# Patient Record
Sex: Female | Born: 1955 | Race: White | Hispanic: No | Marital: Married | Smoking: Never smoker
Health system: Southern US, Community
[De-identification: ages and names within clinical notes are randomized; demographics above are authoritative.]

## PROBLEM LIST (undated history)

## (undated) DIAGNOSIS — K449 Diaphragmatic hernia without obstruction or gangrene: Secondary | ICD-10-CM

## (undated) DIAGNOSIS — D259 Leiomyoma of uterus, unspecified: Secondary | ICD-10-CM

## (undated) DIAGNOSIS — F329 Major depressive disorder, single episode, unspecified: Secondary | ICD-10-CM

## (undated) DIAGNOSIS — M797 Fibromyalgia: Secondary | ICD-10-CM

## (undated) DIAGNOSIS — G4700 Insomnia, unspecified: Secondary | ICD-10-CM

## (undated) DIAGNOSIS — R5382 Chronic fatigue, unspecified: Secondary | ICD-10-CM

## (undated) DIAGNOSIS — M1990 Unspecified osteoarthritis, unspecified site: Secondary | ICD-10-CM

## (undated) DIAGNOSIS — J45909 Unspecified asthma, uncomplicated: Secondary | ICD-10-CM

## (undated) DIAGNOSIS — E079 Disorder of thyroid, unspecified: Secondary | ICD-10-CM

## (undated) DIAGNOSIS — F32A Depression, unspecified: Secondary | ICD-10-CM

## (undated) DIAGNOSIS — K219 Gastro-esophageal reflux disease without esophagitis: Secondary | ICD-10-CM

## (undated) DIAGNOSIS — E785 Hyperlipidemia, unspecified: Secondary | ICD-10-CM

## (undated) HISTORY — PX: APPENDECTOMY: SHX54

## (undated) HISTORY — DX: Major depressive disorder, single episode, unspecified: F32.9

## (undated) HISTORY — DX: Disorder of thyroid, unspecified: E07.9

## (undated) HISTORY — DX: Hyperlipidemia, unspecified: E78.5

## (undated) HISTORY — PX: TONSILECTOMY, ADENOIDECTOMY, BILATERAL MYRINGOTOMY AND TUBES: SHX2538

## (undated) HISTORY — DX: Leiomyoma of uterus, unspecified: D25.9

## (undated) HISTORY — DX: Unspecified asthma, uncomplicated: J45.909

## (undated) HISTORY — PX: OVARIAN CYST REMOVAL: SHX89

## (undated) HISTORY — DX: Insomnia, unspecified: G47.00

## (undated) HISTORY — DX: Chronic fatigue, unspecified: R53.82

## (undated) HISTORY — PX: TMJ ARTHROSCOPY: SHX1067

## (undated) HISTORY — DX: Depression, unspecified: F32.A

## (undated) HISTORY — DX: Fibromyalgia: M79.7

## (undated) HISTORY — DX: Diaphragmatic hernia without obstruction or gangrene: K44.9

## (undated) HISTORY — DX: Unspecified osteoarthritis, unspecified site: M19.90

## (undated) HISTORY — DX: Gastro-esophageal reflux disease without esophagitis: K21.9

---

## 1981-12-18 HISTORY — PX: TEMPOROMANDIBULAR JOINT SURGERY: SHX35

## 1983-12-19 HISTORY — PX: OSTEOTOMY: SHX137

## 1983-12-19 HISTORY — PX: RHINOPLASTY: SUR1284

## 1988-12-18 HISTORY — PX: OTHER SURGICAL HISTORY: SHX169

## 2009-07-26 ENCOUNTER — Ambulatory Visit: Payer: Self-pay | Admitting: Family Medicine

## 2009-07-26 DIAGNOSIS — M542 Cervicalgia: Secondary | ICD-10-CM | POA: Insufficient documentation

## 2009-07-26 DIAGNOSIS — R5381 Other malaise: Secondary | ICD-10-CM | POA: Insufficient documentation

## 2009-07-26 DIAGNOSIS — R5383 Other fatigue: Secondary | ICD-10-CM

## 2009-07-26 DIAGNOSIS — G4700 Insomnia, unspecified: Secondary | ICD-10-CM | POA: Insufficient documentation

## 2009-07-26 DIAGNOSIS — F329 Major depressive disorder, single episode, unspecified: Secondary | ICD-10-CM

## 2009-07-26 DIAGNOSIS — F3289 Other specified depressive episodes: Secondary | ICD-10-CM | POA: Insufficient documentation

## 2009-08-10 DIAGNOSIS — E638 Other specified nutritional deficiencies: Secondary | ICD-10-CM

## 2009-08-17 ENCOUNTER — Telehealth: Payer: Self-pay | Admitting: Family Medicine

## 2009-08-26 ENCOUNTER — Encounter: Payer: Self-pay | Admitting: Family Medicine

## 2009-08-31 LAB — CONVERTED CEMR LAB
ALT: 17 units/L (ref 0–35)
Basophils Absolute: 0 10*3/uL (ref 0.0–0.1)
Cholesterol: 251 mg/dL — ABNORMAL HIGH (ref 0–200)
FSH: 26.4 milliintl units/mL
Indirect Bilirubin: 0.3 mg/dL (ref 0.0–0.9)
Lymphocytes Relative: 32 % (ref 12–46)
Neutro Abs: 3.9 10*3/uL (ref 1.7–7.7)
Neutrophils Relative %: 57 % (ref 43–77)
Platelets: 364 10*3/uL (ref 150–400)
Potassium: 4.4 meq/L (ref 3.5–5.3)
RDW: 16.2 % — ABNORMAL HIGH (ref 11.5–15.5)
Sodium: 137 meq/L (ref 135–145)
Total CHOL/HDL Ratio: 5.8
Total Protein: 7.6 g/dL (ref 6.0–8.3)
Triglycerides: 225 mg/dL — ABNORMAL HIGH (ref <150)
VLDL: 45 mg/dL — ABNORMAL HIGH (ref 0–40)

## 2009-09-07 ENCOUNTER — Ambulatory Visit: Payer: Self-pay | Admitting: Family Medicine

## 2009-09-07 DIAGNOSIS — N949 Unspecified condition associated with female genital organs and menstrual cycle: Secondary | ICD-10-CM

## 2009-09-07 DIAGNOSIS — E785 Hyperlipidemia, unspecified: Secondary | ICD-10-CM

## 2009-09-23 DIAGNOSIS — G894 Chronic pain syndrome: Secondary | ICD-10-CM

## 2009-11-29 ENCOUNTER — Telehealth: Payer: Self-pay | Admitting: Family Medicine

## 2009-11-30 ENCOUNTER — Encounter: Payer: Self-pay | Admitting: Family Medicine

## 2010-01-03 ENCOUNTER — Ambulatory Visit: Payer: Self-pay | Admitting: Family Medicine

## 2010-01-03 DIAGNOSIS — D239 Other benign neoplasm of skin, unspecified: Secondary | ICD-10-CM | POA: Insufficient documentation

## 2010-05-17 ENCOUNTER — Encounter: Payer: Self-pay | Admitting: Family Medicine

## 2010-06-08 ENCOUNTER — Ambulatory Visit: Payer: Self-pay | Admitting: Family Medicine

## 2010-06-08 LAB — CONVERTED CEMR LAB
LDL Cholesterol: 176 mg/dL — ABNORMAL HIGH (ref 0–99)
Total CHOL/HDL Ratio: 6.4
Triglycerides: 175 mg/dL — ABNORMAL HIGH (ref <150)
VLDL: 35 mg/dL (ref 0–40)

## 2010-06-22 ENCOUNTER — Encounter: Payer: Self-pay | Admitting: Family Medicine

## 2011-01-17 NOTE — Letter (Signed)
Summary: referral to dr. Hart Rochester  referral to dr. Hart Rochester   Imported By: Rudene Anda 01/03/2010 15:22:24  _____________________________________________________________________  External Attachment:    Type:   Image     Comment:   External Document

## 2011-01-17 NOTE — Assessment & Plan Note (Signed)
Summary: office visit   Vital Signs:  Patient profile:   55 year old female Height:      64 inches Weight:      157.75 pounds BMI:     27.18 O2 Sat:      98 % Pulse rate:   91 / minute Pulse rhythm:   regular Resp:     16 per minute BP sitting:   110 / 82  (left arm) Cuff size:   regular  Vitals Entered By: Everitt Amber LPN (June 08, 2010 1:21 PM)  Nutrition Counseling: Patient's BMI is greater than 25 and therefore counseled on weight management options.  CC: Follow up chronic problems   Primary Care Provider:  Syliva Overman MD  CC:  Follow up chronic problems.  History of Present Illness: Pt still experienciong alot of uncontrolled pain, thinks she will eventually take an mRI of her neck. She is still bleeding though irreg, went 3 months without. she continues to opt fior nat meds, is taking extra iodide because of hair loss , fatigue etc.  She rerfuses mamogram, uncertain about the pelvic and pap unlessl she continues  to bleed.excessively. She states that approx 2 weeks ago she hd an upper respiratory infection, which she was able to get rid of with no prescription meds.  Current Medications (verified): 1)  Vicodin 5-500 Mg Tabs (Hydrocodone-Acetaminophen) .... Take 1 Tablet By Mouth Two Times A Day  Allergies (verified): No Known Drug Allergies  Review of Systems      See HPI Eyes:  Denies blurring, eye pain, and red eye. Endo:  Denies excessive hunger, excessive thirst, heat intolerance, and weight change. Heme:  Denies abnormal bruising and bleeding. Allergy:  Complains of seasonal allergies.  Physical Exam  General:  Well-developed,well-nourished,in no acute distress; alert,appropriate and cooperative throughout examination HEENT: No facial asymmetry,  EOMI, No sinus tenderness, TM's Clear, oropharynx  pink and moist.   Chest: Clear to auscultation bilaterally.  CVS: S1, S2, No murmurs, No S3.   Abd: Soft, Nontender.  MS: decreased  ROM spine,adequate  in hips, shoulders and knees.  Ext: No edema.   CNS: CN 2-12 intact, power tone and sensation normal throughout.   Skin: Intact, multiple pigmented lesions, some with irregular borders,    Psych: Good eye contact, normal affect.  Memory intact, not anxious or depressed appearing.    Impression & Recommendations:  Problem # 1:  CHRONIC PAIN SYNDROME (ICD-338.4) Assessment Unchanged pt encouraged to do regular exercise  including stretching, pain management issues discussed and addressed  Problem # 2:  HYPERLIPIDEMIA, SEVERE (ICD-272.4) Assessment: Unchanged  Orders: T-Lipid Profile ([id number])  Labs Reviewed: SGOT: 20 (08/26/2009)   SGPT: 17 (08/26/2009)   HDL:39 (06/07/2010), 43 (08/26/2009)  LDL:176 (06/07/2010), 163 (08/26/2009)  Chol:250 (06/07/2010), 251 (08/26/2009)  Trig:175 (06/07/2010), 225 (08/26/2009) pt has no desire to take meds at this time, dietary management only  Problem # 3:  DEPRESSION (ICD-311) Assessment: Improved  Complete Medication List: 1)  Vicodin 5-500 Mg Tabs (Hydrocodone-acetaminophen) .... Take 1 tablet by mouth once a day as needed  Other Orders: T-Basic Metabolic Panel [phone]) T-CBC w/Diff [phone]) T-TSH [phone])  Patient Instructions: 1)  Please schedule a follow-up appointment in 4.5 months. 2)  BMP prior to visit, ICD-9: 3)  Lipid Panel prior to visit, ICD-9:  fasting in 4.5 months 4)  TSH prior to visit, ICD-9: 5)  CBC w/ Diff prior to visit, ICD-9: 6)  pls follow a low fat diet Prescriptions: VICODIN 5-500 MG TABS (  HYDROCODONE-ACETAMINOPHEN) Take 1 tablet by mouth once a day as needed  #30 x 3   Entered and Authorized by:   Syliva Overman MD   Signed by:   Syliva Overman MD on 06/08/2010   Method used:   Printed then faxed to ...       [facility]* (retail)       [address]       [place], [place]  [address]       Ph: [phone] or [phone]       Fax: [phone]   RxID:    [id number]

## 2011-01-17 NOTE — Assessment & Plan Note (Signed)
Summary: MEDS   Vital Signs:  Patient profile:   55 year old female Height:      64 inches Weight:      161.75 pounds BMI:     27.86 O2 Sat:      97 % Pulse rate:   83 / minute Pulse rhythm:   regular Resp:     16 per minute BP sitting:   110 / 80 Cuff size:   regular  Vitals Entered By: Everitt Amber (January 03, 2010 9:47 AM)  Nutrition Counseling: Patient's BMI is greater than 25 and therefore counseled on weight management options. CC: Follow up chronic problems Is Patient Diabetic? No   Primary Care Provider:  Syliva Overman MD  CC:  Follow up chronic problems.  History of Present Illness: Pt reports that her encounter  with neurology was dissatisfying, she has opted not to get imaging  and was suceesfuklly doing icing  and had even startyed walking with her spouse before Christmas.  She did have flu in Christmas  and this set her back. Increased fatigue, aches excessively, wants no more meds,and actually plans to d/c vicodin entirely in Decmber.  She states her bleeding has actually cut down some , and wants to see Dorita Fray in [place] in April,  Has concern s  are about skin lesions on the  left cheek and post left back, enlarguing in size, wants derm soon  Current Medications (verified): 1)  Vicodin 5-500 Mg Tabs (Hydrocodone-Acetaminophen) .... Take 1 Tablet By Mouth Two Times A Day  Allergies (verified): No Known Drug Allergies  Review of Systems General:  Complains of fatigue, malaise, and sleep disorder; denies chills and fever. Eyes:  Denies blurring and discharge. ENT:  Denies hoarseness, nasal congestion, and sinus pressure. CV:  Denies chest pain or discomfort, near fainting, and swelling of feet. Resp:  Complains of wheezing; denies cough and sputum productive; reports being "cured of asthma". GI:  Denies abdominal pain, constipation, diarrhea, and nausea. GU:  Denies dysuria and urinary frequency; still has menorragia, though not as severe. MS:  Complains of  joint pain, low back pain, and mid back pain; increased neck and back pain, not walking as before. Derm:  Complains of lesion(s); increased number and size of nevi, requests derm eval. Neuro:  Denies seizures and sensation of room spinning. Psych:  Complains of anxiety and depression; denies suicidal thoughts/plans, thoughts of violence, and unusual visions or sounds; recentluy under increased stress due to ill health of spouse. Endo:  Denies cold intolerance, excessive hunger, excessive thirst, excessive urination, heat intolerance, polyuria, and weight change. Heme:  Denies abnormal bruising. Allergy:  Denies hives or rash and sneezing.  Physical Exam  General:  Well-developed,well-nourished,in no acute distress; alert,appropriate and cooperative throughout examination HEENT: No facial asymmetry,  EOMI, No sinus tenderness, TM's Clear, oropharynx  pink and moist.   Chest: Clear to auscultation bilaterally.  CVS: S1, S2, No murmurs, No S3.   Abd: Soft, Nontender.  MS: decreased  ROM spine,adequate in hips, shoulders and knees.  Ext: No edema.   CNS: CN 2-12 intact, power tone and sensation normal throughout.   Skin: Intact, multiple pigmented lesions, some with irregular borders,    Psych: Good eye contact, normal affect.  Memory intact, not anxious or depressed appearing.    Impression & Recommendations:  Problem # 1:  NEVI, MULTIPLE (ICD-216.9) Assessment Comment Only  Orders: Dermatology Referral (Derma)  Problem # 2:  CHRONIC PAIN SYNDROME (ICD-338.4) Assessment: Deteriorated pt encouraged to do  stretching exercisies, meditation, and we laso discussed tapering off of narcotic pain meds as able over the next 8 to 12 months. She is distraught that darvocet has been removed from tth e market  Problem # 3:  MENORRHAGIA, PERIMENOPAUSAL (ICD-626.8) Assessment: Comment Only  Orders: Gynecologic Referral (Gyn), now requesting local gynae in April  Problem # 4:  FATIGUE  (ICD-780.79) Assessment: Deteriorated  Orders: T-Vitamin D (25-Hydroxy) ([id number])  Problem # 5:  INSOMNIA (ICD-780.52) Assessment: Deteriorated  Discussed sleep hygiene.   Problem # 6:  NECK AND BACK PAIN (ICD-723.1) Assessment: Deteriorated  His updated medication list for this problem includes:    Vicodin 5-500 Mg Tabs (Hydrocodone-acetaminophen) .Marland Kitchen... Take 1 tablet by mouth two times a day  Complete Medication List: 1)  Vicodin 5-500 Mg Tabs (Hydrocodone-acetaminophen) .... Take 1 tablet by mouth two times a day  Other Orders: T-Lipid Profile ([id number])  Patient Instructions: 1)  F/U in May. 2)  Lipid Panel prior to visit, ICD-9: 3)  Vit D level   in April orMay 4)  You will be referred to [facility] in April, also to derm asap 5)  Pls try to do stretch exercise daily. 6)  Pls practice good sleep habts Prescriptions: VICODIN 5-500 MG TABS (HYDROCODONE-ACETAMINOPHEN) Take 1 tablet by mouth two times a day  #60 x 2   Entered by:   Everitt Amber   Authorized by:   Syliva Overman MD   Signed by:   Everitt Amber on 01/03/2010   Method used:   Printed then faxed to ...       [facility]* (retail)       [address]       [place], [place]  [address]       Ph: [phone] or [phone]       Fax: [phone]   RxID:   [id number]

## 2011-01-17 NOTE — Progress Notes (Signed)
Summary: dermatology  dermatology   Imported By: Lind Guest 07/08/2010 15:21:13  _____________________________________________________________________  External Attachment:    Type:   Image     Comment:   External Document

## 2011-02-27 ENCOUNTER — Ambulatory Visit: Payer: Self-pay | Admitting: Family Medicine

## 2011-03-10 ENCOUNTER — Telehealth: Payer: Self-pay | Admitting: Family Medicine

## 2011-03-14 ENCOUNTER — Other Ambulatory Visit: Payer: Self-pay

## 2011-03-14 MED ORDER — IPRATROPIUM-ALBUTEROL 0.5-2.5 (3) MG/3ML IN SOLN: 3.0000 mL | Freq: Four times a day (QID) | RESPIRATORY_TRACT | Status: DC | PRN

## 2011-03-14 MED ORDER — ALBUTEROL SULFATE HFA 108 (90 BASE) MCG/ACT IN AERS: 2.0000 | INHALATION_SPRAY | Freq: Four times a day (QID) | RESPIRATORY_TRACT | Status: DC | PRN

## 2011-03-22 ENCOUNTER — Telehealth: Payer: Self-pay | Admitting: *Deleted

## 2011-03-22 DIAGNOSIS — E785 Hyperlipidemia, unspecified: Secondary | ICD-10-CM

## 2011-03-22 DIAGNOSIS — R5383 Other fatigue: Secondary | ICD-10-CM

## 2011-03-22 LAB — CBC WITH DIFFERENTIAL/PLATELET
Basophils Relative: 1 % (ref 0–1)
Eosinophils Absolute: 0.7 10*3/uL (ref 0.0–0.7)
Eosinophils Relative: 11 % — ABNORMAL HIGH (ref 0–5)
HCT: 43.9 % (ref 36.0–46.0)
Hemoglobin: 14.2 g/dL (ref 12.0–15.0)
Lymphs Abs: 1.7 10*3/uL (ref 0.7–4.0)
MCH: 24.5 pg — ABNORMAL LOW (ref 26.0–34.0)
MCHC: 32.3 g/dL (ref 30.0–36.0)
MCV: 75.7 fL — ABNORMAL LOW (ref 78.0–100.0)
Monocytes Absolute: 0.5 10*3/uL (ref 0.1–1.0)
Monocytes Relative: 8 % (ref 3–12)

## 2011-03-22 LAB — LIPID PANEL
Cholesterol: 209 mg/dL — ABNORMAL HIGH (ref 0–200)
Triglycerides: 85 mg/dL (ref <150)
VLDL: 17 mg/dL (ref 0–40)

## 2011-03-22 LAB — BASIC METABOLIC PANEL
BUN: 12 mg/dL (ref 6–23)
Calcium: 9.5 mg/dL (ref 8.4–10.5)
Creat: 0.87 mg/dL (ref 0.40–1.20)
Potassium: 4.8 mEq/L (ref 3.5–5.3)

## 2011-03-22 NOTE — Telephone Encounter (Signed)
New lab order sent

## 2011-03-27 ENCOUNTER — Encounter: Payer: Self-pay | Admitting: Family Medicine

## 2011-03-27 NOTE — Telephone Encounter (Signed)
Pt has appt this week, will discuss then recently called in with asthma flare needing to be seen and has not been in for months so I kept her appt

## 2011-03-27 NOTE — Telephone Encounter (Signed)
done

## 2011-03-27 NOTE — Telephone Encounter (Signed)
No result note

## 2011-03-28 ENCOUNTER — Ambulatory Visit (INDEPENDENT_AMBULATORY_CARE_PROVIDER_SITE_OTHER): Payer: Federal, State, Local not specified - PPO | Admitting: Family Medicine

## 2011-03-28 ENCOUNTER — Telehealth: Payer: Self-pay | Admitting: Family Medicine

## 2011-03-28 ENCOUNTER — Encounter: Payer: Self-pay | Admitting: Family Medicine

## 2011-03-28 VITALS — BP 110/80 | HR 92 | Resp 16 | Ht 65.5 in | Wt 138.0 lb

## 2011-03-28 DIAGNOSIS — M542 Cervicalgia: Secondary | ICD-10-CM

## 2011-03-28 DIAGNOSIS — N949 Unspecified condition associated with female genital organs and menstrual cycle: Secondary | ICD-10-CM

## 2011-03-28 DIAGNOSIS — J45909 Unspecified asthma, uncomplicated: Secondary | ICD-10-CM

## 2011-03-28 DIAGNOSIS — E785 Hyperlipidemia, unspecified: Secondary | ICD-10-CM

## 2011-03-28 DIAGNOSIS — G894 Chronic pain syndrome: Secondary | ICD-10-CM

## 2011-03-28 MED ORDER — FLUTICASONE-SALMETEROL 250-50 MCG/DOSE IN AEPB: 1.0000 | INHALATION_SPRAY | Freq: Two times a day (BID) | RESPIRATORY_TRACT | Status: DC

## 2011-03-28 MED ORDER — HYDROCODONE-ACETAMINOPHEN 5-500 MG PO TABS: 1.0000 | ORAL_TABLET | Freq: Every day | ORAL | Status: DC | PRN

## 2011-03-28 MED ORDER — PREDNISONE (PAK) 5 MG PO TABS: 5.0000 mg | ORAL_TABLET | ORAL | Status: AC

## 2011-03-28 NOTE — Progress Notes (Signed)
  Subjective:    Patient ID: Jennifer Hicks, female    DOB: [birth year], 55 y.o.   MRN: [id number]  HPI  Pt is currently having a flare of asthma which she though was cured in 2009, since then she has jhad wheezing, coughing , dyspnea, no sputum, fever or chlls. nocturnal cough persits, can't sleep , states she stops breathing, wants prednisone dose pack.  Had concerns about being diabetic or having thyroid disease, her recent labs are normal. She believes in homeopathc medicine and keeps in touch with a "provider" in [place].  States her pain has been gone in the neck, however since her cough has started ,her neck pain has rebounded. Pt feels essentially that she has  yeast overgrowth in her body, since following a diet and exercise program with a 30 pound weight loss,  Continues to refuse all cancer screening tests  Review of Systems Denies recent fever or chills. Denies sinus pressure, nasal congestion, ear pain or sore throat. Denies chest congestion, productive cough or wheezing. Denies chest pains, palpitations, paroxysmal nocturnal dyspnea, orthopnea and leg swelling Denies abdominal pain, nausea, vomiting,diarrhea or constipation.  Denies rectal bleeding or change in bowel movement. Denies dysuria, frequency, hesitancy or incontinence. Denies joint pain, swelling and limitation in mobility. Denies headaches, seizure, numbness, or tingling. Denies uncontrolled  depression, anxiety or insomnia. Denies skin break down or rash.         Objective:   Physical Exam    Patient alert and oriented and in no Cardiopulmonary distress.  HEENT: No facial asymmetry, EOMI, no sinus tenderness, TM's clear, Oropharynx pink and moist.  Neck supple no adenopathy.  Chest:decreased air entry with bilateral wheezes CVS: S1, S2 no murmurs, no S3.  ABD: Soft non tender. Bowel sounds normal.  Ext: No edema  MS: Adequate ROM spine, shoulders, hips and knees.  Skin: Intact, no ulcerations or  rash noted.  Psych: Good eye contact, normal affect. Memory intact not anxious or depressed appearing.  CNS: CN 2-12 intact, power, tone and sensation normal throughout.     Assessment & Plan:

## 2011-03-28 NOTE — Patient Instructions (Addendum)
F/U in 6 months  I still encourage you to get cancer screening tests. I am sending in a prednisone dose pack , if you continue to cough with green sputum, you need to take antibiotics and should call in

## 2011-03-28 NOTE — Telephone Encounter (Signed)
Called patient left message

## 2011-03-29 ENCOUNTER — Other Ambulatory Visit: Payer: Self-pay

## 2011-03-29 DIAGNOSIS — M542 Cervicalgia: Secondary | ICD-10-CM

## 2011-03-29 MED ORDER — HYDROCODONE-ACETAMINOPHEN 5-500 MG PO TABS: 1.0000 | ORAL_TABLET | Freq: Every day | ORAL | Status: DC | PRN

## 2011-03-29 NOTE — Telephone Encounter (Signed)
Wanted copy of her labs mailed to her

## 2011-04-16 ENCOUNTER — Encounter: Payer: Self-pay | Admitting: Family Medicine

## 2011-04-16 DIAGNOSIS — J45909 Unspecified asthma, uncomplicated: Secondary | ICD-10-CM | POA: Insufficient documentation

## 2011-04-16 NOTE — Assessment & Plan Note (Signed)
Improved but persits, by history, full gynae eval still outstading

## 2011-04-16 NOTE — Assessment & Plan Note (Signed)
.  unchanged, low fat diet discussed, [pt does not want medication, she refuses

## 2011-04-16 NOTE — Assessment & Plan Note (Signed)
Deteriorated, resume pain med

## 2011-04-16 NOTE — Assessment & Plan Note (Signed)
Deteriorated resume std therapy and fill [prednisone dose pack

## 2011-07-09 ENCOUNTER — Emergency Department (HOSPITAL_COMMUNITY)
Admission: EM | Admit: 2011-07-09 | Discharge: 2011-07-09 | Disposition: A | Discharge status: Home / Self Care | Payer: Federal, State, Local not specified - PPO | Attending: Emergency Medicine | Admitting: Emergency Medicine

## 2011-07-09 ENCOUNTER — Emergency Department (HOSPITAL_COMMUNITY): Payer: Federal, State, Local not specified - PPO

## 2011-07-09 DIAGNOSIS — K5900 Constipation, unspecified: Secondary | ICD-10-CM | POA: Insufficient documentation

## 2011-07-09 DIAGNOSIS — D259 Leiomyoma of uterus, unspecified: Secondary | ICD-10-CM | POA: Insufficient documentation

## 2011-07-09 DIAGNOSIS — E039 Hypothyroidism, unspecified: Secondary | ICD-10-CM | POA: Insufficient documentation

## 2011-07-09 DIAGNOSIS — N390 Urinary tract infection, site not specified: Secondary | ICD-10-CM | POA: Insufficient documentation

## 2011-07-09 DIAGNOSIS — R109 Unspecified abdominal pain: Secondary | ICD-10-CM | POA: Insufficient documentation

## 2011-07-09 LAB — COMPREHENSIVE METABOLIC PANEL
Albumin: 3.8 g/dL (ref 3.5–5.2)
Alkaline Phosphatase: 59 U/L (ref 39–117)
BUN: 10 mg/dL (ref 6–23)
Chloride: 101 mEq/L (ref 96–112)
Creatinine, Ser: 0.62 mg/dL (ref 0.50–1.10)
GFR calc Af Amer: >60 mL/min (ref >60)
Glucose, Bld: 86 mg/dL (ref 70–99)
Potassium: 4.1 mEq/L (ref 3.5–5.1)
Total Bilirubin: 0.5 mg/dL (ref 0.3–1.2)
Total Protein: 7.3 g/dL (ref 6.0–8.3)

## 2011-07-09 LAB — DIFFERENTIAL
Eosinophils Relative: 2 % (ref 0–5)
Lymphocytes Relative: 26 % (ref 12–46)
Lymphs Abs: 2 10*3/uL (ref 0.7–4.0)
Monocytes Absolute: 0.7 10*3/uL (ref 0.1–1.0)
Monocytes Relative: 9 % (ref 3–12)
Neutro Abs: 4.9 10*3/uL (ref 1.7–7.7)

## 2011-07-09 LAB — URINALYSIS, ROUTINE W REFLEX MICROSCOPIC
Ketones, ur: NEGATIVE mg/dL
Nitrite: NEGATIVE
Protein, ur: NEGATIVE mg/dL
pH: 6 (ref 5.0–8.0)

## 2011-07-09 LAB — LIPASE, BLOOD: Lipase: 47 U/L (ref 11–59)

## 2011-07-09 LAB — CBC
HCT: 41.7 % (ref 36.0–46.0)
Hemoglobin: 13.9 g/dL (ref 12.0–15.0)
MCHC: 33.3 g/dL (ref 30.0–36.0)
MCV: 76.7 fL — ABNORMAL LOW (ref 78.0–100.0)
RDW: 14.3 % (ref 11.5–15.5)

## 2011-07-09 LAB — URINE MICROSCOPIC-ADD ON

## 2011-07-09 MED ORDER — IOHEXOL 300 MG/ML  SOLN: 100.0000 mL | Freq: Once | INTRAMUSCULAR | Status: DC | PRN

## 2011-07-10 ENCOUNTER — Encounter: Payer: Self-pay | Admitting: *Deleted

## 2011-07-10 ENCOUNTER — Telehealth: Payer: Self-pay | Admitting: Internal Medicine

## 2011-07-10 NOTE — Telephone Encounter (Signed)
Spoke with patient. Scheduled her on 07/31/11 at 8:45 AM with Dr. Juanda Chance. Patient states she has never had a colonoscopy but had an EGD about 8 years ago when she live in [place], [place]. She thinks it was done in [place]. She will try to get the records for [place]. Letter mailed to patient.

## 2011-07-11 LAB — URINE CULTURE: Colony Count: >=100000

## 2011-07-31 ENCOUNTER — Encounter: Payer: Self-pay | Admitting: Internal Medicine

## 2011-07-31 ENCOUNTER — Ambulatory Visit (INDEPENDENT_AMBULATORY_CARE_PROVIDER_SITE_OTHER): Payer: Federal, State, Local not specified - PPO | Admitting: Internal Medicine

## 2011-07-31 VITALS — BP 112/68 | HR 74 | Ht 65.0 in | Wt 144.0 lb

## 2011-07-31 DIAGNOSIS — K5901 Slow transit constipation: Secondary | ICD-10-CM

## 2011-07-31 DIAGNOSIS — R933 Abnormal findings on diagnostic imaging of other parts of digestive tract: Secondary | ICD-10-CM

## 2011-07-31 MED ORDER — PEG-KCL-NACL-NASULF-NA ASC-C 100 G PO SOLR: 1.0000 | Freq: Once | ORAL | Status: AC

## 2011-07-31 MED ORDER — POLYETHYLENE GLYCOL 3350 17 GM/SCOOP PO POWD: ORAL | Status: AC

## 2011-07-31 NOTE — Patient Instructions (Addendum)
Please take your Sitzmark capsule with at least 8 ounces of water on Thursday 08/03/11 @ 10 am. Please go to [facility] (basement floor) for your KUB x ray on Tuesday 08/08/11 before 10 am. Make sure not to use ANY laxatives, fiber supplements during this time. You have been scheduled for a colonoscopy. Please follow written instructions given to you at your visit today.  Please pick up your Moviprep kit at the pharmacy within the next 2-3 days. We have sent the following medications to your pharmacy for you to pick up at your convenience: Miralax. Take 17 grams dissolved in at least 8 ounces of water/juice and drink once daily AFTER your colonoscopy has been completed. CC :Dr Judie Petit.Simpson, Dr C. Romine

## 2011-07-31 NOTE — Progress Notes (Signed)
Jennifer Hicks [birth year] MRN [id number]     History of Present Illness:  This is a 55 year old white female with chronic constipation. She was seen in the emergency room on 07/10/2011 with constipation and abdominal pain. A CT scan of the abdomen showed a moderate size hiatal hernia, normal gallbladder, and enlarged uterus with fibroids. Her chemistries were normal including a Hgb of 13.9. She is a Investment banker, corporate of homeopathic medicine. She has chronic pain syndrome for  which she takes hydrocodone 5/500 twice a day. She has fibromyalgia and asthmatic bronchitis. She takes magnesium oxide 1,000 mg a day and saline enemas every 3 days. She does not have any spontaneous bowel movements. She has never had a colonoscopy. She is adopted and therefore there is no family history. She was once treated for gastroesophageal reflux and had an upper endoscopy and was put on PPIs but that does not seem to be a problem. She used to weigh over 200 pounds but has managed to intentionally lose weight down to 133 pounds. She just recently gained 10 pounds to 144 pounds. Her thyroid studies according to her are normal but she still feels she has a problem with the thyroid. She believes she has had chronic yeast infections for which she takes homeopathic medications. She has an appointment with Dr. Tresa Res on August 27th to address the issue of uterine fibroids.   Past Medical History  Diagnosis Date  . Fibromyalgia   . Chronic fatigue     started at age 44  . Osteoporosis   . Childhood asthma     with multiple conplications, was in Doc 's office through out  childhood , allergy shot, uncontrolled asthma and scarring of her lungs,  . Asthma     gone since 2009 after healing at [facility]   . GERD (gastroesophageal reflux disease)   . 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 Depression     was on paxil for 14 years   . Insomnia   . Hyperlipidemia   . Hiatal  hernia   . Uterine fibroid   . Constipation    Past Surgical History  Procedure Date  . Appendectomy   . Tonsilectomy, adenoidectomy, bilateral myringotomy and tubes   . Ovarian cyst removal     teenager   . Cesarean section 1982  . Temporomandibular joint surgery 1983  . 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 Rhinoplasty 1985    to correct nose collaspe following nasal surgery   . Tmj proplast removal 1990    reports that she has never smoked. She has never used smokeless tobacco. She reports that she does not drink alcohol or use illicit drugs. family history includes Diabetes in her father and mother and Heart disease in her father.  There is no history of Colon cancer. No Known Allergies      Review of Systems:Denies heartburn, chest pain, shortness of breath, dysphagia  The remainder of the 10  point ROS is negative except as outlined in H&P   Physical Exam: General appearance  Well developed, in no distress. Eyes- non icteric. HEENT nontraumatic, normocephalic. Mouth no lesions, tongue papillated, no cheilosis. Neck supple without adenopathy, thyroid not enlarged, no carotid bruits, no JVD. Lungs Clear to auscultation bilaterally. Cor normal S1 normal S2, regular rhythm , no murmur,  quiet precordium. Abdomen Soft scaphoid. Normal active bowel sounds. No distention. Liver edge at costal  margin. Well-healed surgical scar from laparotomy and C-section. Tenderness in left lower quadrant. No palpable mass. Rectal:Normal perianal area. Normal rectal sphincter tone. No stool in the ampulla. Extremities no pedal edema. Skin no lesions. Neurological alert and oriented x 3. Psychological normal mood and affect.  Assessment and Plan:  Problem #1 severe constipation. It is multifactorial due to  her taking analgesics, inactivity since she stays in bed most of the time and low fiber intake. She eats very little fiber as  I found out when taking her dietary history. I also  suspect  laxative use over many years if a factor. 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 She may also have a redundant colon. She is definitely focused on her bowels. We will do a Sitzmarks study for colon transit time followed by a colonoscopy exam. She will then start MiraLax 17 g once or twice a day. We have talked about increasing her activity, increasing fiber intake and decreasing her pain medications.   07/31/2011 Lina Sar

## 2011-08-07 ENCOUNTER — Telehealth: Payer: Self-pay | Admitting: *Deleted

## 2011-08-07 NOTE — Telephone Encounter (Signed)
Received a call from radiology that patient called and told them she was not coming for the KUB for sitz markers. Spoke with patient and she states she did not have a bowel movement for 10 days. She could not go without doing an enema today. States she "held off" until today and had to use an enema. Patient states she is dehydrated and sick today. She states she is going to keep her GYN appointment next week.

## 2011-08-07 NOTE — Telephone Encounter (Signed)
OK 

## 2011-08-22 ENCOUNTER — Telehealth: Payer: Self-pay | Admitting: Internal Medicine

## 2011-08-22 NOTE — Telephone Encounter (Signed)
Please charge pt for  Late cancel. She also did not do her Sitz mark study by not showing up for her x-ray.

## 2011-08-24 ENCOUNTER — Other Ambulatory Visit: Payer: Federal, State, Local not specified - PPO | Admitting: Internal Medicine

## 2011-09-21 ENCOUNTER — Encounter: Payer: Self-pay | Admitting: Family Medicine

## 2011-09-26 ENCOUNTER — Ambulatory Visit (INDEPENDENT_AMBULATORY_CARE_PROVIDER_SITE_OTHER): Payer: Federal, State, Local not specified - PPO | Admitting: Family Medicine

## 2011-09-26 VITALS — BP 130/82 | HR 74 | Resp 16 | Ht 65.5 in | Wt 143.8 lb

## 2011-09-26 DIAGNOSIS — M549 Dorsalgia, unspecified: Secondary | ICD-10-CM

## 2011-09-26 DIAGNOSIS — J45909 Unspecified asthma, uncomplicated: Secondary | ICD-10-CM

## 2011-09-26 DIAGNOSIS — M899 Disorder of bone, unspecified: Secondary | ICD-10-CM

## 2011-09-26 DIAGNOSIS — R5381 Other malaise: Secondary | ICD-10-CM

## 2011-09-26 DIAGNOSIS — M542 Cervicalgia: Secondary | ICD-10-CM

## 2011-09-26 DIAGNOSIS — E785 Hyperlipidemia, unspecified: Secondary | ICD-10-CM

## 2011-09-26 DIAGNOSIS — R5383 Other fatigue: Secondary | ICD-10-CM

## 2011-09-26 DIAGNOSIS — D649 Anemia, unspecified: Secondary | ICD-10-CM

## 2011-09-26 DIAGNOSIS — F329 Major depressive disorder, single episode, unspecified: Secondary | ICD-10-CM

## 2011-09-26 MED ORDER — HYDROCODONE-ACETAMINOPHEN 5-500 MG PO TABS: 1.0000 | ORAL_TABLET | Freq: Every day | ORAL | Status: DC | PRN

## 2011-09-26 NOTE — Progress Notes (Signed)
  Subjective:    Patient ID: Jennifer Hicks, female    DOB: [birth year], 55 y.o.   MRN: [id number]  HPI Recently in the ED 07/22 with possible intestinasl obstruction, has seen both gynae and gI since. No plan for hysterectomy ,despite ongoing heavy vaginal bleeding. Has not had 3 weeks of hydrotherapy to help with bowel movements, will return for colonscopy when bMsare regular. Pt  Will make the appt when bowels move. Pt had pap and biopsy of endometriumboth reportedly reassuring Reports fatigue due to excess bleed  A lot of neck and back pain requests refill on medication for this. Has discontinued asthma medications with no recent flares    Review of Systems See HPI Denies recent fever or chills. Denies sinus pressure, nasal congestion, ear pain or sore throat. Denies chest congestion, productive cough or wheezing. Denies chest pains, palpitations and leg swelling Denies abdominal pain, nausea, vomiting,   Denies dysuria, frequency, hesitancy or incontinence.  Denies headaches, seizures, numbness, or tingling. C/o increased depression,however not suicidal, homicidal or hallucinating, refuses medication at this time Denies skin break down or rash.        Objective:   Physical Exam Patient alert and oriented and in no cardiopulmonary distress.  HEENT: No facial asymmetry, EOMI, no sinus tenderness,  oropharynx pink and moist.  Neck supple no adenopathy.  Chest: Clear to auscultation bilaterally.  CVS: S1, S2 no murmurs, no S3.  ABD: Soft non tender. Bowel sounds normal.  Ext: No edema  MS: Adequate though reduced  ROM spine, shoulders, hips and knees.  Skin: Intact, no ulcerations or rash noted.  Psych: Good eye contact, normal affect. Memory intact not anxious or depressed appearing.  CNS: CN 2-12 intact, power, tone and sensation normal throughout.        Assessment & Plan:

## 2011-09-26 NOTE — Patient Instructions (Addendum)
F/u in 6 months  pls consider the need for mammogram and colonscopy.  Pain med refilled as before  LABWORK  NEEDS TO BE DONE BETWEEN 3 TO 7 DAYS BEFORE YOUR NEXT SCEDULED  VISIT.  THIS WILL IMPROVE THE QUALITY OF YOUR CARE.

## 2011-10-01 ENCOUNTER — Encounter: Payer: Self-pay | Admitting: Family Medicine

## 2011-10-01 NOTE — Assessment & Plan Note (Signed)
Improved when last checked, Hyperlipidemia:Low fat diet discussed and encouraged.  rept labs prior to next visit

## 2011-10-01 NOTE — Assessment & Plan Note (Signed)
Reports deterioration with increasd cold weather, med renewed unchanged x 6 mnths

## 2011-10-01 NOTE — Assessment & Plan Note (Signed)
Currently stable.

## 2011-10-01 NOTE — Assessment & Plan Note (Signed)
Deteriorated, however no medication at this time

## 2012-03-13 ENCOUNTER — Telehealth: Payer: Self-pay | Admitting: Family Medicine

## 2012-03-13 NOTE — Telephone Encounter (Signed)
Called patient and left message for them to return call at the office   

## 2012-03-14 NOTE — Telephone Encounter (Signed)
Pt aware lab order was sent and it was fasting

## 2012-03-19 ENCOUNTER — Ambulatory Visit: Payer: Federal, State, Local not specified - PPO | Admitting: Family Medicine

## 2012-04-16 ENCOUNTER — Other Ambulatory Visit: Payer: Self-pay | Admitting: Family Medicine

## 2012-04-17 LAB — BASIC METABOLIC PANEL
CO2: 26 mEq/L (ref 19–32)
Calcium: 9.6 mg/dL (ref 8.4–10.5)
Creat: 0.66 mg/dL (ref 0.50–1.10)
Glucose, Bld: 94 mg/dL (ref 70–99)

## 2012-04-17 LAB — VITAMIN B12: Vitamin B-12: 908 pg/mL (ref 211–911)

## 2012-04-17 LAB — TSH: TSH: 0.126 u[IU]/mL — ABNORMAL LOW (ref 0.350–4.500)

## 2012-04-17 LAB — FERRITIN: Ferritin: 25 ng/mL (ref 10–291)

## 2012-04-17 LAB — VITAMIN D 25 HYDROXY (VIT D DEFICIENCY, FRACTURES): Vit D, 25-Hydroxy: 43 ng/mL (ref 30–89)

## 2012-04-17 LAB — IRON AND TIBC: Iron: 131 ug/dL (ref 42–145)

## 2012-04-22 ENCOUNTER — Other Ambulatory Visit: Payer: Self-pay | Admitting: Family Medicine

## 2012-04-22 ENCOUNTER — Ambulatory Visit (INDEPENDENT_AMBULATORY_CARE_PROVIDER_SITE_OTHER): Payer: Federal, State, Local not specified - PPO | Admitting: Family Medicine

## 2012-04-22 ENCOUNTER — Encounter: Payer: Self-pay | Admitting: Family Medicine

## 2012-04-22 VITALS — BP 112/80 | HR 79 | Resp 16 | Ht 65.5 in | Wt 149.0 lb

## 2012-04-22 DIAGNOSIS — H9190 Unspecified hearing loss, unspecified ear: Secondary | ICD-10-CM

## 2012-04-22 DIAGNOSIS — E638 Other specified nutritional deficiencies: Secondary | ICD-10-CM

## 2012-04-22 DIAGNOSIS — H9319 Tinnitus, unspecified ear: Secondary | ICD-10-CM

## 2012-04-22 DIAGNOSIS — J45909 Unspecified asthma, uncomplicated: Secondary | ICD-10-CM

## 2012-04-22 DIAGNOSIS — H9312 Tinnitus, left ear: Secondary | ICD-10-CM

## 2012-04-22 DIAGNOSIS — H9192 Unspecified hearing loss, left ear: Secondary | ICD-10-CM

## 2012-04-22 DIAGNOSIS — E785 Hyperlipidemia, unspecified: Secondary | ICD-10-CM

## 2012-04-22 DIAGNOSIS — M542 Cervicalgia: Secondary | ICD-10-CM

## 2012-04-22 DIAGNOSIS — R7989 Other specified abnormal findings of blood chemistry: Secondary | ICD-10-CM

## 2012-04-22 DIAGNOSIS — N3000 Acute cystitis without hematuria: Secondary | ICD-10-CM

## 2012-04-22 DIAGNOSIS — R6889 Other general symptoms and signs: Secondary | ICD-10-CM

## 2012-04-22 DIAGNOSIS — R300 Dysuria: Secondary | ICD-10-CM

## 2012-04-22 NOTE — Assessment & Plan Note (Signed)
Deteriorated, pt uses only "natural approaches ", low fat diet encouraged

## 2012-04-22 NOTE — Assessment & Plan Note (Signed)
Worsening pain with extremity symptoms of weakness and numbness, will wait on pt for imaging studies which I believe that she needs

## 2012-04-22 NOTE — Progress Notes (Signed)
  Subjective:    Patient ID: Jennifer Hicks, female    DOB: 31-Dec-1955, 56 y.o.   MRN: [id number]  HPI The PT is here for follow up and re-evaluation of chronic medical conditions, medication management and review of any available recent lab and radiology data.  Preventive health is updated, specifically  Cancer screening and Immunization.  Pt refuses immunization and wants very limited cancer screening done also C/o 1 year h/o left hearing loss and tinnitus. C/o worsening pain in neck and back with progressive weakness and numbness of all extremities, still wants to hold off on imaging.  Recent thyroid test suggests overactive, she has been self medicating with a natural product, states she has noted excessive fatigue in bed most of the time with tremor, willing to see endo at this time       Review of Systems See HPI Denies recent fever or chills. Denies sinus pressure, nasal congestion, ear pain or sore throat. Denies chest congestion, productive cough or wheezing. Denies chest pains, palpitations and leg swelling Denies abdominal pain, nausea, vomiting,diarrhea or constipation.   C/o mild  Dysuria and  frequency, for the past approx 5 days, has  h/o UTI in the past Denies headaches, seizures, numbness, or tingling. Denies depressionor insomnia. Denies skin break down or rash.        Objective:   Physical Exam  Patient alert and oriented and in no cardiopulmonary distress.  HEENT: No facial asymmetry, EOMI, no sinus tenderness,  oropharynx pink and moist.  Neck decreased ROM no adenopathy.  Chest: Clear to auscultation bilaterally.  CVS: S1, S2 no murmurs, no S3.  ABD: Soft non tender. Bowel sounds normal.  Ext: No edema  MS: decreased  ROM spine,adequate in  shoulders, hips and knees.  Skin: Intact, no ulcerations or rash noted.  Psych: Good eye contact, normal affect. Memory intact not anxious or depressed appearing.  CNS: CN 2-12 intact,       Assessment &  Plan:

## 2012-04-22 NOTE — Assessment & Plan Note (Signed)
1 year h/o worsening symptom ENT eval

## 2012-04-22 NOTE — Assessment & Plan Note (Signed)
Urine sent for c/s 

## 2012-04-22 NOTE — Patient Instructions (Addendum)
F/U in 6 month  You are referred for evaluation of left hearing loss and vertigo which you have had for 1 year.  You will be referred to an endocrinologist in [place] because of abnormal thyroid function.  Please cut back on cholesterol , fried and fatty foods and chees, your cholesterol is too high  Fasting lipid, and cbc in 6 month  Urine will be checked for infection, antibiotics will be prescribed only if needed based on culture report   I believe that you need scans of your neck and back due to progressive weakness and numbness ou upper and lower extremities

## 2012-04-22 NOTE — Assessment & Plan Note (Signed)
Progressive x 1 year, ENT eval

## 2012-04-22 NOTE — Assessment & Plan Note (Signed)
New lab data suggests overactive gland , endo eval

## 2012-04-22 NOTE — Assessment & Plan Note (Signed)
Stable no flare in the past 6 month

## 2012-04-23 ENCOUNTER — Telehealth: Payer: Self-pay | Admitting: Family Medicine

## 2012-04-24 NOTE — Telephone Encounter (Signed)
Sent to pt.

## 2012-04-25 ENCOUNTER — Other Ambulatory Visit: Payer: Self-pay | Admitting: Family Medicine

## 2012-04-25 MED ORDER — CIPROFLOXACIN HCL 500 MG PO TABS: 500.0000 mg | ORAL_TABLET | Freq: Two times a day (BID) | ORAL | Status: AC

## 2012-04-26 ENCOUNTER — Other Ambulatory Visit: Payer: Self-pay | Admitting: Family Medicine

## 2012-04-29 ENCOUNTER — Ambulatory Visit: Payer: Federal, State, Local not specified - PPO | Admitting: Endocrinology

## 2012-04-30 ENCOUNTER — Telehealth: Payer: Self-pay | Admitting: Family Medicine

## 2012-04-30 ENCOUNTER — Other Ambulatory Visit: Payer: Self-pay | Admitting: Family Medicine

## 2012-04-30 MED ORDER — AMPICILLIN 500 MG PO CAPS: 500.0000 mg | ORAL_CAPSULE | Freq: Two times a day (BID) | ORAL | Status: DC

## 2012-04-30 NOTE — Telephone Encounter (Signed)
Sent in again 

## 2012-05-01 ENCOUNTER — Telehealth: Payer: Self-pay | Admitting: Family Medicine

## 2012-05-01 DIAGNOSIS — M542 Cervicalgia: Secondary | ICD-10-CM

## 2012-05-01 MED ORDER — HYDROCODONE-ACETAMINOPHEN 5-500 MG PO TABS: 1.0000 | ORAL_TABLET | Freq: Every day | ORAL | Status: DC | PRN

## 2012-05-01 NOTE — Telephone Encounter (Signed)
Sent in

## 2012-05-10 ENCOUNTER — Ambulatory Visit: Payer: Federal, State, Local not specified - PPO | Admitting: Endocrinology

## 2012-07-12 ENCOUNTER — Telehealth: Payer: Self-pay | Admitting: Family Medicine

## 2012-07-12 MED ORDER — ALBUTEROL SULFATE HFA 108 (90 BASE) MCG/ACT IN AERS: 2.0000 | INHALATION_SPRAY | Freq: Four times a day (QID) | RESPIRATORY_TRACT | Status: DC | PRN

## 2012-07-12 MED ORDER — IPRATROPIUM-ALBUTEROL 0.5-2.5 (3) MG/3ML IN SOLN: 3.0000 mL | Freq: Four times a day (QID) | RESPIRATORY_TRACT | Status: AC | PRN

## 2012-07-12 NOTE — Telephone Encounter (Signed)
meds sent in per request

## 2012-07-26 ENCOUNTER — Telehealth: Payer: Self-pay | Admitting: Family Medicine

## 2012-07-26 MED ORDER — PREDNISONE (PAK) 5 MG PO TABS: ORAL_TABLET | ORAL | Status: DC

## 2012-07-26 NOTE — Telephone Encounter (Signed)
pls erx prednisone 5 mg dose pack x 6 days, #21 tabs and let her know if her condition worsens needs to go to Ed or call and come in for OV

## 2012-07-26 NOTE — Telephone Encounter (Signed)
Pt aware.

## 2012-07-26 NOTE — Telephone Encounter (Signed)
States she is having an asthma flare and needs an rx for prednisone. Please advise

## 2012-09-13 ENCOUNTER — Other Ambulatory Visit: Payer: Self-pay | Admitting: Family Medicine

## 2012-09-13 ENCOUNTER — Telehealth: Payer: Self-pay

## 2012-09-13 DIAGNOSIS — E638 Other specified nutritional deficiencies: Secondary | ICD-10-CM

## 2012-09-13 DIAGNOSIS — E785 Hyperlipidemia, unspecified: Secondary | ICD-10-CM

## 2012-09-13 LAB — CBC WITH DIFFERENTIAL/PLATELET
Basophils Absolute: 0 10*3/uL (ref 0.0–0.1)
Basophils Relative: 1 % (ref 0–1)
Eosinophils Relative: 9 % — ABNORMAL HIGH (ref 0–5)
Lymphocytes Relative: 27 % (ref 12–46)
MCHC: 34.5 g/dL (ref 30.0–36.0)
MCV: 81.4 fL (ref 78.0–100.0)
Neutro Abs: 4.2 10*3/uL (ref 1.7–7.7)
Platelets: 262 10*3/uL (ref 150–400)
RDW: 13.5 % (ref 11.5–15.5)
WBC: 7.8 10*3/uL (ref 4.0–10.5)

## 2012-09-13 LAB — LIPID PANEL: HDL: 43 mg/dL (ref >39)

## 2012-09-13 NOTE — Telephone Encounter (Signed)
Labs ordered.

## 2012-09-17 ENCOUNTER — Encounter: Payer: Self-pay | Admitting: Family Medicine

## 2012-09-17 ENCOUNTER — Ambulatory Visit (INDEPENDENT_AMBULATORY_CARE_PROVIDER_SITE_OTHER): Payer: Federal, State, Local not specified - PPO | Admitting: Family Medicine

## 2012-09-17 VITALS — BP 104/70 | HR 91 | Resp 15 | Ht 65.5 in | Wt 147.8 lb

## 2012-09-17 DIAGNOSIS — R6889 Other general symptoms and signs: Secondary | ICD-10-CM

## 2012-09-17 DIAGNOSIS — M542 Cervicalgia: Secondary | ICD-10-CM

## 2012-09-17 DIAGNOSIS — J45909 Unspecified asthma, uncomplicated: Secondary | ICD-10-CM

## 2012-09-17 DIAGNOSIS — H9192 Unspecified hearing loss, left ear: Secondary | ICD-10-CM

## 2012-09-17 DIAGNOSIS — L900 Lichen sclerosus et atrophicus: Secondary | ICD-10-CM

## 2012-09-17 DIAGNOSIS — Z139 Encounter for screening, unspecified: Secondary | ICD-10-CM

## 2012-09-17 DIAGNOSIS — R7989 Other specified abnormal findings of blood chemistry: Secondary | ICD-10-CM

## 2012-09-17 DIAGNOSIS — L940 Localized scleroderma [morphea]: Secondary | ICD-10-CM

## 2012-09-17 DIAGNOSIS — H9190 Unspecified hearing loss, unspecified ear: Secondary | ICD-10-CM

## 2012-09-17 DIAGNOSIS — G894 Chronic pain syndrome: Secondary | ICD-10-CM

## 2012-09-17 DIAGNOSIS — E785 Hyperlipidemia, unspecified: Secondary | ICD-10-CM

## 2012-09-17 LAB — TSH: TSH: 5.558 u[IU]/mL — ABNORMAL HIGH (ref 0.350–4.500)

## 2012-09-17 MED ORDER — PREDNISONE (PAK) 5 MG PO TABS: 5.0000 mg | ORAL_TABLET | ORAL | Status: AC

## 2012-09-17 MED ORDER — FLUTICASONE-SALMETEROL 250-50 MCG/DOSE IN AEPB: 1.0000 | INHALATION_SPRAY | Freq: Two times a day (BID) | RESPIRATORY_TRACT | Status: DC

## 2012-09-17 MED ORDER — CLOBETASOL PROPIONATE 0.05 % EX OINT: TOPICAL_OINTMENT | CUTANEOUS | Status: AC

## 2012-09-17 NOTE — Progress Notes (Signed)
  Subjective:    Patient ID: Jennifer Hicks, female    DOB: Apr 26, 1956, 56 y.o.   MRN: [id number]  HPI The PT is here for follow up and re-evaluation of chronic medical conditions, medication management and review of any available recent lab and radiology data.  Preventive health is updated, specifically  Cancer screening and Refuses immunization, ad cancer screening.   Questions or concerns regarding consultations or procedures which the PT has had in the interim are  Addressed.Has had ENT eval, no help for her symptoms and confirmed hearing loss in left ear. 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  C/o increased neck pain in past several weeks, no inciting trauma C/o fatigue, and has increased asthma symptoms     Review of Systems See HPI Denies recent fever or chills.c/o progressive fatigue Denies sinus pressure, nasal congestion, ear pain or sore throat.  Denies chest pains, palpitations and leg swelling Denies abdominal pain, nausea, vomiting,diarrhea or constipation.   Denies dysuria, frequency, hesitancy or incontinence.Still having menstrual flow irregularly, intends to get back to gyne  Denies headaches, seizures, numbness, or tingling. Denies depression, anxiety or insomnia. C/o vulval irritation which has been dx in the past , and responds to topical steroid, requests a refill on med      Objective:   Physical Exam  Patient alert and oriented and in no cardiopulmonary distress.  HEENT: No facial asymmetry, EOMI, no sinus tenderness,  oropharynx pink and moist.  Neck decreased ROM adenopathy.  Chest: Clear to auscultation bilaterally.Decreased air entry throughout  CVS: S1, S2 no murmurs, no S3.  ABD: Soft non tender. Bowel sounds normal.  Ext: No edema  MS: Adequate ROM spine, shoulders, hips and knees.  Skin: Intact, no ulcerations or rash noted.  Psych:  Good eye contact, normal affect. Memory intact not anxious mildly  depressed appearing.  CNS: CN 2-12 intact, power, tone and sensation normal throughout.       Assessment & Plan:

## 2012-09-17 NOTE — Patient Instructions (Addendum)
F/U in  5 month  I will add TSH, free T3 and free T4 and blood sugar to recent labs and call you when available I have sent in prednisone one dose pack only, , advair for 1 year, take every day, and proventil will be refilled x 6 month    Vicodin will be refilled for 6 month for pinched nerve in the neck   Please try and locate an endocrinologist to further evaluate your hormonal system   Important to keep going to gyne   Flu shot recommended I am happy that you saw ENT   Fasting lipid,viT  D TSH free T3, and T4 fasting blood sugar in 5 month before viisit

## 2012-09-18 LAB — GLUCOSE, RANDOM

## 2012-09-20 MED ORDER — HYDROCODONE-ACETAMINOPHEN 5-500 MG PO TABS: 1.0000 | ORAL_TABLET | Freq: Every day | ORAL | Status: DC | PRN

## 2012-09-20 MED ORDER — ALBUTEROL SULFATE HFA 108 (90 BASE) MCG/ACT IN AERS: 2.0000 | INHALATION_SPRAY | Freq: Four times a day (QID) | RESPIRATORY_TRACT | Status: DC | PRN

## 2012-09-23 MED ORDER — FLUTICASONE-SALMETEROL 250-50 MCG/DOSE IN AEPB: 1.0000 | INHALATION_SPRAY | Freq: Two times a day (BID) | RESPIRATORY_TRACT | Status: AC

## 2012-09-23 NOTE — Assessment & Plan Note (Signed)
Reportedly increased, no med change at this time

## 2012-09-23 NOTE — Assessment & Plan Note (Signed)
Symptoms and lab value suggest hypothyroidism. Pt has attempted "natural management ' unsuccesfully.Has specific needs in [place], and is tryng to select one Believes her adrenal gland is an issue. Will await a call for referral to specific Provider she desires, if needed

## 2012-09-23 NOTE — Assessment & Plan Note (Signed)
Has been evaluated by ENT and dx of profound hearing loss in left ear is confirmed, also states she was told hearing aids would not be beneficial

## 2012-09-23 NOTE — Assessment & Plan Note (Signed)
Slight improvement with dietary change. Pt elects not to take medication

## 2012-09-23 NOTE — Assessment & Plan Note (Signed)
Reports increased neck pain, advised massages , no change in med management

## 2012-09-23 NOTE — Assessment & Plan Note (Signed)
Uncontrolled with increased wheezing and dyspnea. Required steroid taper in past 2 monhts. Using daily inhaler at this time. Dose pack prescribed fo use in the event of another flare. Pt also re educated re the need to seek additional medical help if no response

## 2012-10-02 ENCOUNTER — Ambulatory Visit: Payer: Federal, State, Local not specified - PPO | Admitting: Endocrinology

## 2012-10-04 ENCOUNTER — Encounter: Payer: Self-pay | Admitting: Endocrinology

## 2012-10-04 ENCOUNTER — Ambulatory Visit (INDEPENDENT_AMBULATORY_CARE_PROVIDER_SITE_OTHER): Payer: Federal, State, Local not specified - PPO | Admitting: Endocrinology

## 2012-10-04 VITALS — BP 112/68 | HR 85 | Temp 98.1°F | Wt 148.0 lb

## 2012-10-04 DIAGNOSIS — R6889 Other general symptoms and signs: Secondary | ICD-10-CM

## 2012-10-04 DIAGNOSIS — R7989 Other specified abnormal findings of blood chemistry: Secondary | ICD-10-CM

## 2012-10-04 NOTE — Progress Notes (Signed)
Subjective:    Patient ID: Jennifer Hicks, female    DOB: 05-13-1956, 56 y.o.   MRN: [id number]  HPI Pt states few years of slight blurry vision from both eyes, and assoc fatigue.  For several years, she has been taking dessicated thyroid and iodine supplement.   Past Medical History  Diagnosis Date  . Fibromyalgia   . Chronic fatigue     started at age 80  . Osteoporosis   . Childhood asthma     with multiple conplications, was in Doc 's office through out  childhood , allergy shot, uncontrolled asthma and scarring of her lungs,  . Asthma     gone since 2009 after healing at [facility]   . GERD (gastroesophageal reflux disease)   . 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 Depression     was on paxil for 14 years   . Insomnia   . Hyperlipidemia   . Hiatal hernia   . Uterine fibroid   . Constipation     Past Surgical History  Procedure Date  . Appendectomy   . Tonsilectomy, adenoidectomy, bilateral myringotomy and tubes   . Ovarian cyst removal     teenager   . Cesarean section 1982  . Temporomandibular joint surgery 1983  . 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 Rhinoplasty 1985    to correct nose collaspe following nasal surgery   . Tmj proplast removal 1990    History   Social History  . Marital Status: Married    Spouse Name: N/A    Number of Children: 2  . Years of Education: N/A   Occupational History  . homemaker     Social History Main Topics  . Smoking status: Never Smoker   . Smokeless tobacco: Never Used  . Alcohol Use: No  . Drug Use: No  . Sexually Active: Not on file   Other Topics Concern  . Not on file   Social History Narrative   One natural child and one adopted child 2 caffeine drinks daily     Current Outpatient Prescriptions on File Prior to Visit  Medication Sig Dispense Refill  . albuterol (PROVENTIL HFA) 108 (90 BASE) MCG/ACT inhaler Inhale 2 puffs into the lungs every 6 (six)  hours as needed for wheezing.  6.7 g  3  . clobetasol ointment (TEMOVATE) 0.05 % Apply twice daily as needed for itch  60 g  0  . Fluticasone-Salmeterol (ADVAIR DISKUS) 250-50 MCG/DOSE AEPB Inhale 1 puff into the lungs 2 (two) times daily.  60 each  5  . HYDROcodone-acetaminophen (VICODIN) 5-500 MG per tablet Take 1 tablet by mouth daily as needed for pain.  30 tablet  5  . ipratropium-albuterol (DUONEB) 0.5-2.5 (3) MG/3ML SOLN Take 3 mLs by nebulization every 6 (six) hours as needed.  360 mL  3  . peg 3350 powder (MOVIPREP) 100 G SOLR Take 1 kit (100 g total) by mouth once.  1 kit  0  . polyethylene glycol powder (GLYCOLAX/MIRALAX) powder Dissolve 17 grams (1 capful) in at least 8 ounces water/juice and drink once daily.  527 g  2  . predniSONE (STERAPRED UNI-PAK) 5 MG TABS Take 1 tablet (5 mg total) by mouth as directed.  21 tablet  0  . Sodium Phosphates (ENEMA RE) Place rectally as needed.          No Known Allergies  Family History  Problem Relation Age of Onset  . Diabetes Mother   . Diabetes Father   . Heart disease Father   . Colon cancer Neg Hx   adopted  BP 112/68  Pulse 85  Temp 98.1 F (36.7 C) (Oral)  Wt 148 lb (67.132 kg)  SpO2 97%  LMP 03/06/2011  Review of Systems She reports excessive thirst, tinnitus, doe, weight gain, dry skin, pain throughout the body, difficulty with concentration, numbness of all 4's, constipation, and foamy urine.   denies depression, hair loss, cramps, rhinorrhea, easy bruising, and syncope.      Objective:   Physical Exam VS: see vs page GEN: no distress HEAD: head: no deformity eyes: no periorbital swelling, no proptosis external nose and ears are normal mouth: no lesion seen NECK: supple, thyroid is not enlarged CHEST WALL: no deformity LUNGS:  Clear to auscultation CV: reg rate and rhythm, no murmur ABD: abdomen is soft, nontender.  no hepatosplenomegaly.  not distended.  no hernia MUSCULOSKELETAL: muscle bulk and strength are  grossly normal.  no obvious joint swelling.  gait is normal and steady EXTEMITIES: no deformity.  no ulcer on the feet.  feet are of normal color and temp.  no edema PULSES: dorsalis pedis intact bilat.  no carotid bruit NEURO:  cn 2-12 grossly intact.   readily moves all 4's.  sensation is intact to touch on the feet SKIN:  Normal texture and temperature.  No rash or suspicious lesion is visible.   NODES:  None palpable at the neck.   PSYCH: alert, oriented x3.  Does not appear anxious nor depressed.  Lab Results  Component Value Date   TSH 5.558* 09/13/2012   T4TOTAL 10.8 04/16/2012      Assessment & Plan:  Mild hypothyroidism, new, ? Related to OTC meds H/o recent hyperthyroidism, same possible reason.  She needs to d/c these in order to make proper dx. Blurry vision and other sxs, not thyroid-related

## 2012-10-04 NOTE — Patient Instructions (Addendum)
It is unclear why your blood tests are off, but the medications may be the cause. The best treatment is to stop the thyroid pills and iodine liquid. Please recheck the blood tests in 1 month.  i would be happy to request the blood test, or you could bo at dr simpson's office.   I would be happy to see you back here whenever you want.

## 2012-11-07 ENCOUNTER — Telehealth: Payer: Self-pay | Admitting: Family Medicine

## 2012-11-07 NOTE — Progress Notes (Signed)
Pt has had a lot of appts with dr. Everardo [place] and she cancelled each one of them

## 2013-01-31 ENCOUNTER — Other Ambulatory Visit: Payer: Self-pay | Admitting: Family Medicine

## 2013-02-01 ENCOUNTER — Other Ambulatory Visit: Payer: Self-pay

## 2013-04-14 ENCOUNTER — Telehealth: Payer: Self-pay | Admitting: Family Medicine

## 2013-04-14 DIAGNOSIS — M542 Cervicalgia: Secondary | ICD-10-CM

## 2013-04-14 MED ORDER — HYDROCODONE-ACETAMINOPHEN 5-325 MG PO TABS: 1.0000 | ORAL_TABLET | Freq: Once | ORAL | Status: AC

## 2013-04-14 NOTE — Telephone Encounter (Signed)
Last appt in Oct. You wanted her to follow up in 5 months. Pt states she can't get out of bed and needs her vicodin. Ok to fill for 1 month until she can make appt?

## 2013-04-14 NOTE — Telephone Encounter (Signed)
pls fill 1 month oNLY, explain to her she needs to make and keep appt in 3 weeks maximum, or else she will have no further refills

## 2013-04-14 NOTE — Telephone Encounter (Signed)
Patient aware.

## 2013-04-16 ENCOUNTER — Ambulatory Visit: Payer: Federal, State, Local not specified - PPO | Admitting: Family Medicine

## 2013-07-10 ENCOUNTER — Other Ambulatory Visit: Payer: Self-pay | Admitting: Family Medicine

## 2013-09-22 ENCOUNTER — Telehealth: Payer: Self-pay | Admitting: Family Medicine

## 2013-09-22 NOTE — Telephone Encounter (Signed)
No refills please, pt has changed PCP effective 09/22/2013

## 2013-09-23 NOTE — Telephone Encounter (Signed)
Noted  

## 2013-10-23 ENCOUNTER — Other Ambulatory Visit: Payer: Self-pay

## 2014-11-11 ENCOUNTER — Encounter (HOSPITAL_COMMUNITY): Payer: Self-pay | Admitting: *Deleted

## 2014-11-11 ENCOUNTER — Emergency Department (HOSPITAL_COMMUNITY)
Admission: EM | Admit: 2014-11-11 | Discharge: 2014-11-11 | Disposition: A | Discharge status: Home / Self Care | Payer: Federal, State, Local not specified - PPO | Attending: Emergency Medicine | Admitting: Emergency Medicine

## 2014-11-11 ENCOUNTER — Emergency Department (HOSPITAL_COMMUNITY): Payer: Federal, State, Local not specified - PPO

## 2014-11-11 DIAGNOSIS — Z79891 Long term (current) use of opiate analgesic: Secondary | ICD-10-CM | POA: Diagnosis not present

## 2014-11-11 DIAGNOSIS — J45909 Unspecified asthma, uncomplicated: Secondary | ICD-10-CM | POA: Diagnosis not present

## 2014-11-11 DIAGNOSIS — E079 Disorder of thyroid, unspecified: Secondary | ICD-10-CM | POA: Diagnosis not present

## 2014-11-11 DIAGNOSIS — L900 Lichen sclerosus et atrophicus: Secondary | ICD-10-CM | POA: Diagnosis not present

## 2014-11-11 DIAGNOSIS — E785 Hyperlipidemia, unspecified: Secondary | ICD-10-CM | POA: Diagnosis not present

## 2014-11-11 DIAGNOSIS — M1990 Unspecified osteoarthritis, unspecified site: Secondary | ICD-10-CM | POA: Diagnosis not present

## 2014-11-11 DIAGNOSIS — K449 Diaphragmatic hernia without obstruction or gangrene: Secondary | ICD-10-CM | POA: Insufficient documentation

## 2014-11-11 DIAGNOSIS — Z7951 Long term (current) use of inhaled steroids: Secondary | ICD-10-CM | POA: Insufficient documentation

## 2014-11-11 DIAGNOSIS — K219 Gastro-esophageal reflux disease without esophagitis: Secondary | ICD-10-CM | POA: Insufficient documentation

## 2014-11-11 DIAGNOSIS — R5382 Chronic fatigue, unspecified: Secondary | ICD-10-CM | POA: Insufficient documentation

## 2014-11-11 DIAGNOSIS — Q782 Osteopetrosis: Secondary | ICD-10-CM | POA: Insufficient documentation

## 2014-11-11 DIAGNOSIS — K5909 Other constipation: Secondary | ICD-10-CM | POA: Insufficient documentation

## 2014-11-11 DIAGNOSIS — K5900 Constipation, unspecified: Secondary | ICD-10-CM

## 2014-11-11 DIAGNOSIS — R109 Unspecified abdominal pain: Secondary | ICD-10-CM | POA: Diagnosis present

## 2014-11-11 DIAGNOSIS — D259 Leiomyoma of uterus, unspecified: Secondary | ICD-10-CM | POA: Insufficient documentation

## 2014-11-11 DIAGNOSIS — M797 Fibromyalgia: Secondary | ICD-10-CM | POA: Diagnosis not present

## 2014-11-11 LAB — CBC WITH DIFFERENTIAL/PLATELET
BASOS ABS: 0 10*3/uL (ref 0.0–0.1)
Basophils Relative: 0 % (ref 0–1)
EOS PCT: 4 % (ref 0–5)
Eosinophils Absolute: 0.4 10*3/uL (ref 0.0–0.7)
HCT: 43.7 % (ref 36.0–46.0)
Hemoglobin: 15 g/dL (ref 12.0–15.0)
LYMPHS PCT: 27 % (ref 12–46)
Lymphs Abs: 2.2 10*3/uL (ref 0.7–4.0)
MCH: 28.2 pg (ref 26.0–34.0)
MCHC: 34.3 g/dL (ref 30.0–36.0)
MCV: 82.3 fL (ref 78.0–100.0)
Monocytes Absolute: 0.8 10*3/uL (ref 0.1–1.0)
Monocytes Relative: 9 % (ref 3–12)
Neutro Abs: 4.7 10*3/uL (ref 1.7–7.7)
Neutrophils Relative %: 60 % (ref 43–77)
PLATELETS: 255 10*3/uL (ref 150–400)
RBC: 5.31 MIL/uL — AB (ref 3.87–5.11)
RDW: 12.8 % (ref 11.5–15.5)
WBC: 8 10*3/uL (ref 4.0–10.5)

## 2014-11-11 LAB — COMPREHENSIVE METABOLIC PANEL
ALT: 16 U/L (ref 0–35)
AST: 20 U/L (ref 0–37)
Albumin: 4 g/dL (ref 3.5–5.2)
Alkaline Phosphatase: 56 U/L (ref 39–117)
Anion gap: 14 (ref 5–15)
BUN: 14 mg/dL (ref 6–23)
CALCIUM: 9.7 mg/dL (ref 8.4–10.5)
CHLORIDE: 92 meq/L — AB (ref 96–112)
CO2: 24 meq/L (ref 19–32)
Creatinine, Ser: 0.7 mg/dL (ref 0.50–1.10)
GFR calc Af Amer: >90 mL/min (ref >90)
GFR calc non Af Amer: >90 mL/min (ref >90)
Glucose, Bld: 109 mg/dL — ABNORMAL HIGH (ref 70–99)
Potassium: 4.5 mEq/L (ref 3.7–5.3)
Sodium: 130 mEq/L — ABNORMAL LOW (ref 137–147)
Total Bilirubin: 0.3 mg/dL (ref 0.3–1.2)
Total Protein: 7.2 g/dL (ref 6.0–8.3)

## 2014-11-11 LAB — URINE MICROSCOPIC-ADD ON

## 2014-11-11 LAB — URINALYSIS, ROUTINE W REFLEX MICROSCOPIC
Bilirubin Urine: NEGATIVE
GLUCOSE, UA: NEGATIVE mg/dL
HGB URINE DIPSTICK: NEGATIVE
Ketones, ur: NEGATIVE mg/dL
Nitrite: NEGATIVE
PH: 6 (ref 5.0–8.0)
Protein, ur: NEGATIVE mg/dL
Specific Gravity, Urine: 1.004 — ABNORMAL LOW (ref 1.005–1.030)
Urobilinogen, UA: 0.2 mg/dL (ref 0.0–1.0)

## 2014-11-11 LAB — LIPASE, BLOOD: Lipase: 50 U/L (ref 11–59)

## 2014-11-11 LAB — POC OCCULT BLOOD, ED: FECAL OCCULT BLD: NEGATIVE

## 2014-11-11 MED ORDER — IOHEXOL 300 MG/ML  SOLN
100.0000 mL | Freq: Once | INTRAMUSCULAR | Status: AC | PRN
  Administered 2014-11-11: 100 mL via INTRAVENOUS

## 2014-11-11 NOTE — ED Notes (Signed)
Pt refused bp cuff to be used on her due to pain.

## 2014-11-11 NOTE — ED Notes (Addendum)
Pt reports constipation. Pt reports last BM  "on her own" was on 11/4.  Since then pt has not had a BM without an enema or laxatives. Pt reports uterine fibroid after CT scan with no bowel obstruction shown.  Pt reports nausea without vomiting x3 days.  Pt went to hydrotherapy yesterday with no result.

## 2014-11-11 NOTE — ED Notes (Signed)
Pt reports having constipation for extended amount of time. Reports her last normal bowel movement was 11/4, has been using otc laxatives and enemas since but now they arent working, even had hydrotherapy yesterday with no relief. Now having nausea also, no vomiting.

## 2014-11-11 NOTE — ED Notes (Signed)
Pt made aware to return if symptoms worsen or if any life threatening symptoms occur.   

## 2014-11-11 NOTE — ED Provider Notes (Signed)
CSN: [id number]     Arrival date & time 11/11/14  1634 History   First MD Initiated Contact with Patient 11/11/14 1813     Chief Complaint  Patient presents with  . Abdominal Pain  . Constipation     (Consider location/radiation/quality/duration/timing/severity/associated sxs/prior Treatment) Patient is a 58 y.o. female presenting with constipation.  Constipation Severity:  Mild Time since last bowel movement:  1 day Timing:  Intermittent Chronicity:  Chronic Context: dehydration   Context: not medication and not stress   Ineffective treatments:  Enemas, fiber, laxatives and stool softeners Associated symptoms: abdominal pain and nausea   Associated symptoms: no back pain, no dysuria, no fever, no flatus and no vomiting   Risk factors: no change in medication, no obesity, no recent surgery and no recent travel     Past Medical History  Diagnosis Date  . Fibromyalgia   . Chronic fatigue     started at age 70  . Osteoporosis   . Childhood asthma     with multiple conplications, was in Doc 's office through out  childhood , allergy shot, uncontrolled asthma and scarring of her lungs,  . Asthma     gone since 2009 after healing at [facility]   . GERD (gastroesophageal reflux disease)   . 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 Depression     was on paxil for 14 years   . Insomnia   . Hyperlipidemia   . Hiatal hernia   . Uterine fibroid   . Constipation   . Arthritis   . Thyroid disease    Past Surgical History  Procedure Laterality Date  . Appendectomy    . Tonsilectomy, adenoidectomy, bilateral myringotomy and tubes    . Ovarian cyst removal      teenager   . Cesarean section  1982  . Temporomandibular joint surgery  1983  . Osteotomy  1985    for TMJ  . Rhinoplasty  1985    to correct nose collaspe following nasal surgery   . Tmj proplast removal  1990  . Rhinoplasty  1985  . Tmj arthroscopy       tmj implant removed 1990  . Cesarean section  1982   Family History  Problem Relation Age of Onset  . Diabetes Mother   . Arthritis Mother   . Diabetes Father   . Heart disease Father   . Arthritis Father   . Colon cancer Neg Hx    History  Substance Use Topics  . Smoking status: Never Smoker   . Smokeless tobacco: Never Used  . Alcohol Use: No   OB History    No data available     Review of Systems  Constitutional: Negative for fever.  HENT: Negative for congestion and drooling.   Eyes: Negative for pain.  Respiratory: Negative for cough, shortness of breath and stridor.   Cardiovascular: Negative for chest pain.  Gastrointestinal: Positive for nausea, abdominal pain and constipation. Negative for vomiting and flatus.  Endocrine: Negative for polydipsia and polyuria.  Genitourinary: Negative for dysuria and flank pain.  Musculoskeletal: Negative for back pain and arthralgias.  Skin: Negative for color change and rash.      Allergies  Review of patient's allergies indicates no known allergies.  Home Medications   Prior to Admission medications   Medication Sig Start Date End Date Taking? Authorizing Provider  Fluticasone-Salmeterol (ADVAIR DISKUS) 500-50  MCG/DOSE AEPB Inhale 1 puff into the lungs 2 (two) times daily.   Yes Historical Provider, MD  HYDROcodone-acetaminophen (NORCO/VICODIN) 5-325 MG per tablet Take 1 tablet by mouth once. Patient taking differently: Take 0.5 tablets by mouth as needed for severe pain.  04/14/13  Yes Fayrene Helper, MD  PROVENTIL HFA 108 (90 BASE) MCG/ACT inhaler INHALE 2 PUFFS EVERY 6 HOURS AS NEEDED FOR WHEEZING 07/10/13  Yes Fayrene Helper, MD  ipratropium-albuterol (DUONEB) 0.5-2.5 (3) MG/3ML SOLN Take 3 mLs by nebulization every 6 (six) hours as needed. Patient not taking: Reported on 11/11/2014 07/12/12   Fayrene Helper, MD  peg 3350 powder (MOVIPREP) 100 G SOLR Take 1 kit (100 g total) by mouth once. Patient not taking:  Reported on 11/11/2014 07/31/11   Lafayette Dragon, MD  polyethylene glycol powder (GLYCOLAX/MIRALAX) powder Dissolve 17 grams (1 capful) in at least 8 ounces water/juice and drink once daily. Patient not taking: Reported on 11/11/2014 07/31/11   Lafayette Dragon, MD  predniSONE (STERAPRED UNI-PAK) 5 MG TABS Take 1 tablet (5 mg total) by mouth as directed. Patient not taking: Reported on 11/11/2014 09/17/12   Fayrene Helper, MD   BP 140/85 mmHg  Pulse 72  Temp(Src) 97.9 F (36.6 C) (Oral)  Resp 16  SpO2 100%  LMP 03/06/2011 Physical Exam  Constitutional: She is oriented to person, place, and time. She appears well-developed and well-nourished.  HENT:  Head: Normocephalic and atraumatic.  Eyes: Conjunctivae and EOM are normal. Right eye exhibits no discharge. Left eye exhibits no discharge.  Cardiovascular: Normal rate and regular rhythm.   Pulmonary/Chest: Effort normal and breath sounds normal. No respiratory distress.  Abdominal: Soft. She exhibits no distension. There is tenderness (minimal). There is no rebound.  Genitourinary: Rectal exam shows no external hemorrhoid, no fissure, no mass and no tenderness. Guaiac negative stool.  Musculoskeletal: Normal range of motion. She exhibits no edema or tenderness.  Neurological: She is alert and oriented to person, place, and time.  Skin: Skin is warm and dry.  Nursing note and vitals reviewed.   ED Course  Procedures (including critical care time) Labs Review Labs Reviewed  CBC WITH DIFFERENTIAL - Abnormal; Notable for the following:    RBC 5.31 (*)    All other components within normal limits  COMPREHENSIVE METABOLIC PANEL - Abnormal; Notable for the following:    Sodium 130 (*)    Chloride 92 (*)    Glucose, Bld 109 (*)    All other components within normal limits  URINALYSIS, ROUTINE W REFLEX MICROSCOPIC - Abnormal; Notable for the following:    Specific Gravity, Urine 1.004 (*)    Leukocytes, UA TRACE (*)    All other  components within normal limits  URINE MICROSCOPIC-ADD ON - Abnormal; Notable for the following:    Squamous Epithelial / LPF FEW (*)    All other components within normal limits  LIPASE, BLOOD  POC OCCULT BLOOD, ED    Imaging Review Ct Abdomen Pelvis W Contrast  11/11/2014   CLINICAL DATA:  Constipation, last normal bowel movement was 11/4.  EXAM: CT ABDOMEN AND PELVIS WITH CONTRAST  TECHNIQUE: Multidetector CT imaging of the abdomen and pelvis was performed using the standard protocol following bolus administration of intravenous contrast.  CONTRAST:  138m OMNIPAQUE IOHEXOL 300 MG/ML  SOLN  COMPARISON:  07/09/2011  FINDINGS: Lower chest: There is no pleural effusion. The lung bases are clear.  Hepatobiliary: There is no focal liver abnormality. The gallbladder appears  normal. There is no biliary dilatation.  Pancreas: Normal appearance of the pancreas.  Spleen: The spleen appears normal.  Adrenals/Urinary Tract: The adrenal glands are both normal. Bilateral pelvocaliectasis noted. There is moderate distension of the urinary bladder. This appears similar to the previous exam. On the delayed images there is symmetric excretion of contrast material.  Stomach/Bowel: There is a small hiatal hernia. The stomach is within normal limits. The small bowel loops have a normal course and caliber. No obstruction. Normal appearance of the colon.  Vascular/Lymphatic: Normal appearance of the abdominal aorta. No enlarged retroperitoneal or mesenteric adenopathy. No enlarged pelvic or inguinal lymph nodes.  Reproductive: Fibroid uterus measures 6.2 x 5.5 by 6.3 cm and has a volume of 107 cc. The adnexal structures both appear normal.  Other: There is no ascites or focal fluid collections within the abdomen or pelvis.  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 anterolisthesis of L5 on S1. There is a scoliosis deformity of the lumbar spine which appears convex towards the right.  IMPRESSION: 1. No  acute findings identified within the abdomen or pelvis. 2. The colon appears normal. An averaged stool burden is identified at this time. 3. Fibroid uterus. 4. Moderate distension of the urinary bladder with bilateral pelvocaliectasis, similar to previous exam. No evidence for high-grade obstructive uropathy. 5. Small hiatal hernia.   Electronically Signed   By: Kerby Moors M.D.   On: 11/11/2014 19:50     EKG Interpretation None      MDM   Final diagnoses:  Constipation    58 yo F w/ lifelong history of constipation. Goes through multiple outpatient treatments for constipation, and unsure if they are working well. Thinks she may be obstructed, not impacted on exam, labs and CT negative for acute abnormalities. Doubt obstruction, no masses. Unsure of her cause of chronic abdominal pain or constipation, can continue working on it as an outpatient.    Merrily Pew, MD 11/12/14 Benancio Deeds  Ezequiel Essex, MD 11/12/14 [phone]

## 2016-04-03 IMAGING — CT CT ABD-PELV W/ CM
2 of 5 series · 15 of 46 positions shown, 17 images · IV contrast (Omni 300)
Comparison: 07/09/2011

CLINICAL DATA: Constipation, last normal bowel movement was [DATE].

EXAM:
CT ABDOMEN AND PELVIS WITH CONTRAST
TECHNIQUE: Multidetector CT imaging of the abdomen and pelvis was performed
using the standard protocol following bolus administration of
intravenous contrast.
CONTRAST:  100mL OMNIPAQUE IOHEXOL 300 MG/ML  SOLN

[Series 2: abd/ pelvis 5.0 i30f 1 · axial · 0.70mm/px · z∈[-820,-455]mm · 12 of 83 slices shown, 14 images]
[im 5/83  soft-tissue]
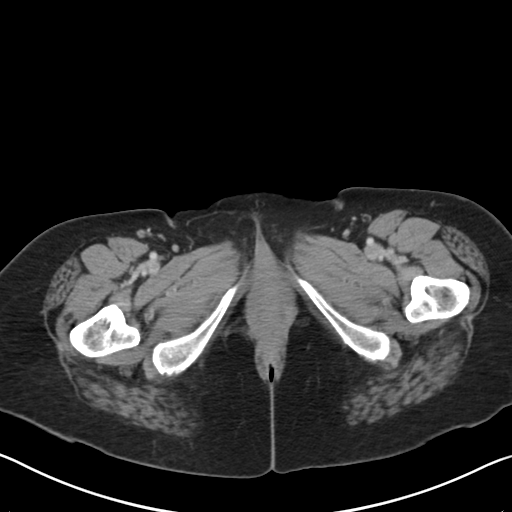
[im 5/83  bone]
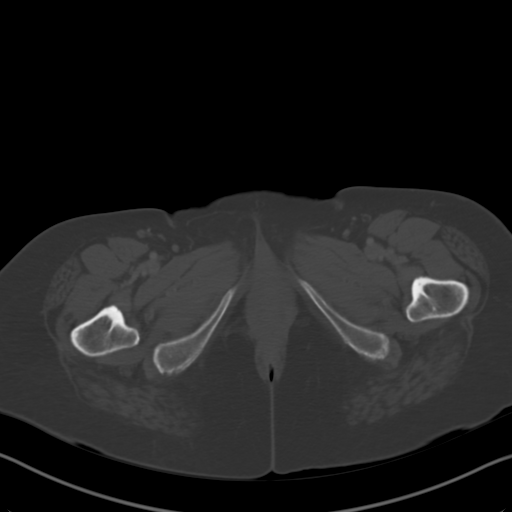
[im 14/83  soft-tissue]
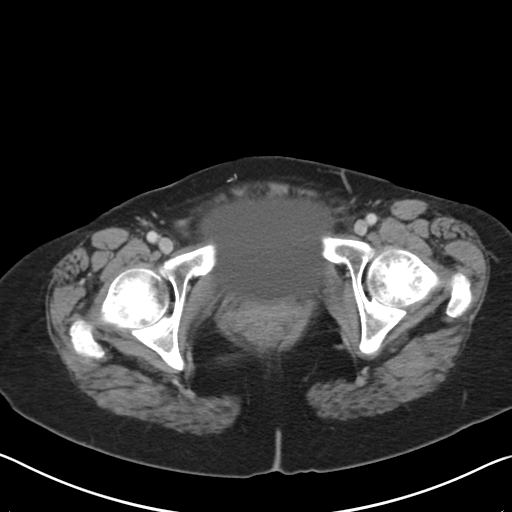
[im 19/83  soft-tissue]
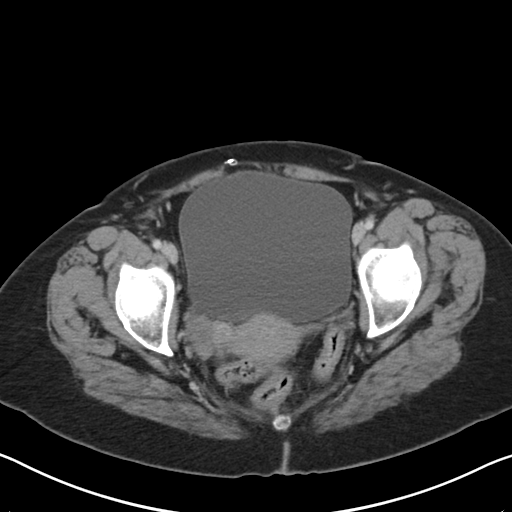
[im 23/83  soft-tissue]
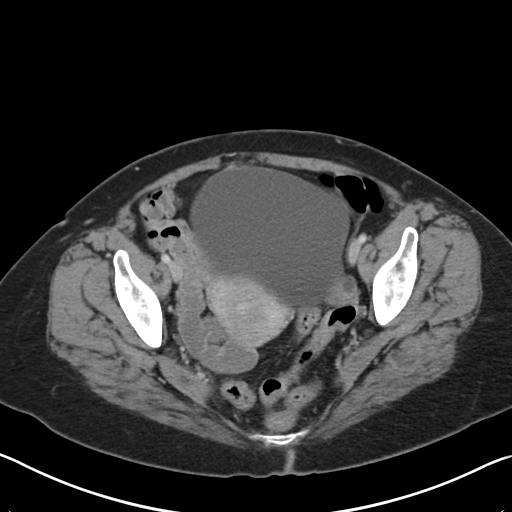
[im 32/83  soft-tissue]
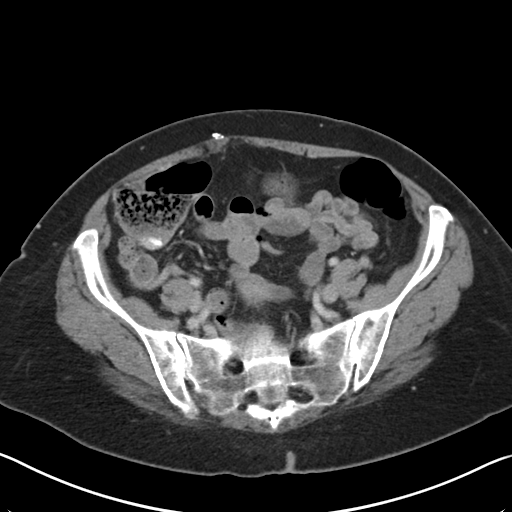
[im 37/83  soft-tissue]
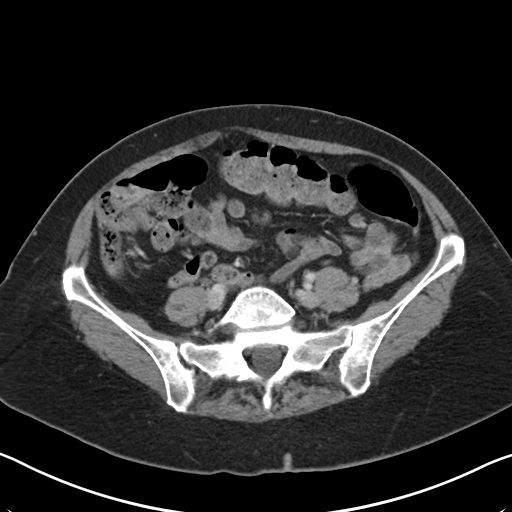
[im 46/83  soft-tissue]
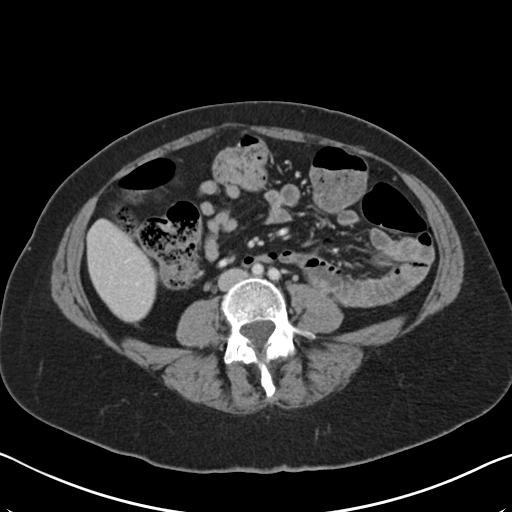
[im 51/83  soft-tissue]
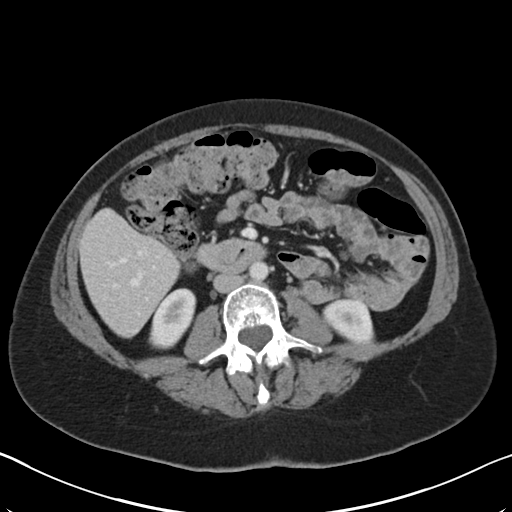
[im 60/83  soft-tissue]
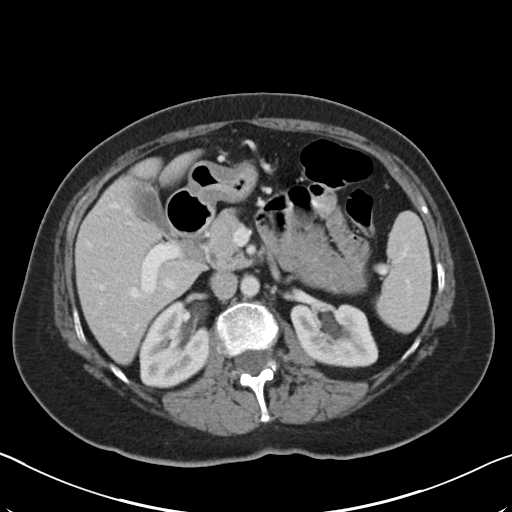
[im 60/83  bone]
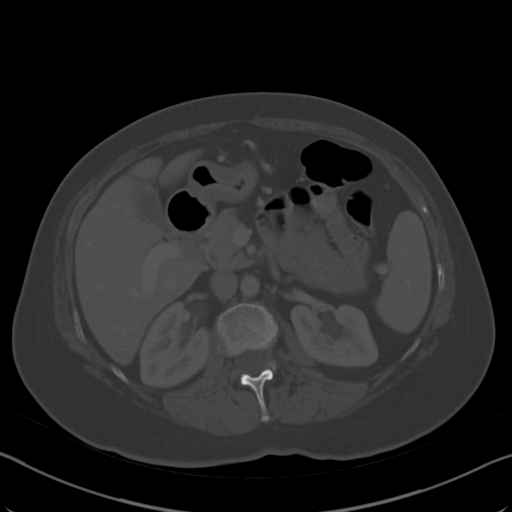
[im 64/83  soft-tissue]
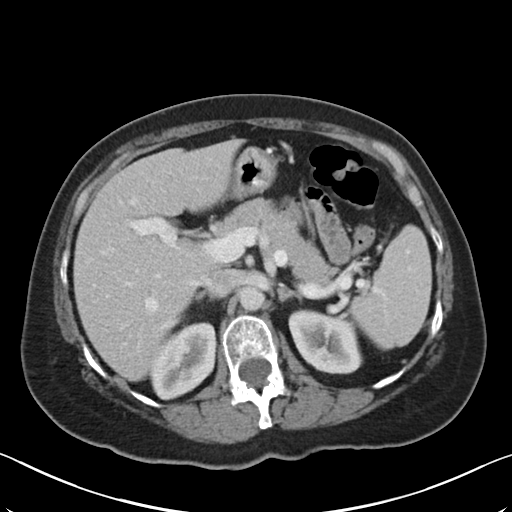
[im 69/83  soft-tissue]
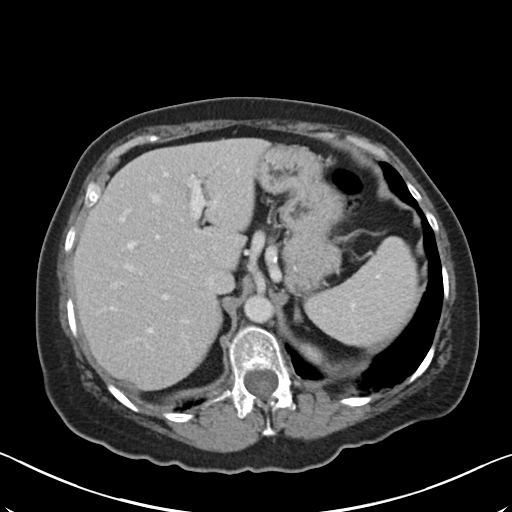
[im 78/83  soft-tissue]
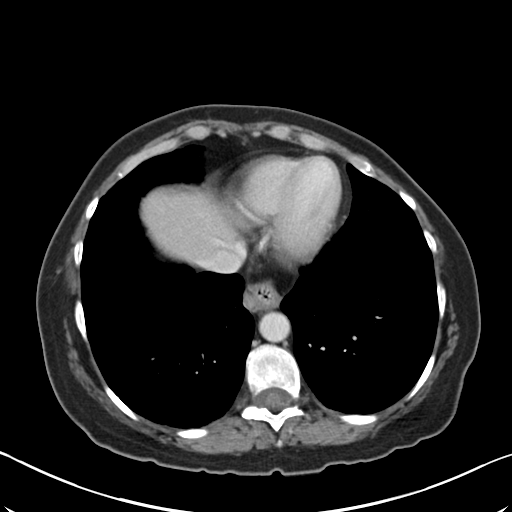

[Series 5: coronals · coronal · 0.62mm/px · 3 of 116 slices shown]
[im 39/116  soft-tissue]
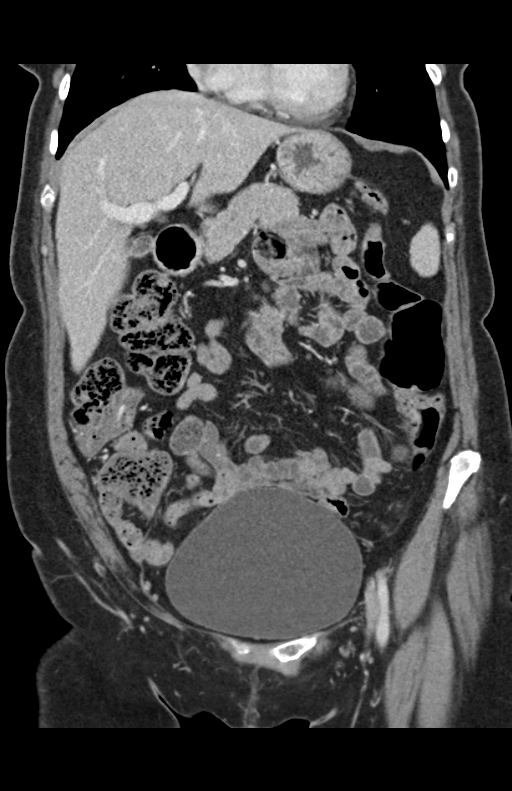
[im 52/116  soft-tissue]
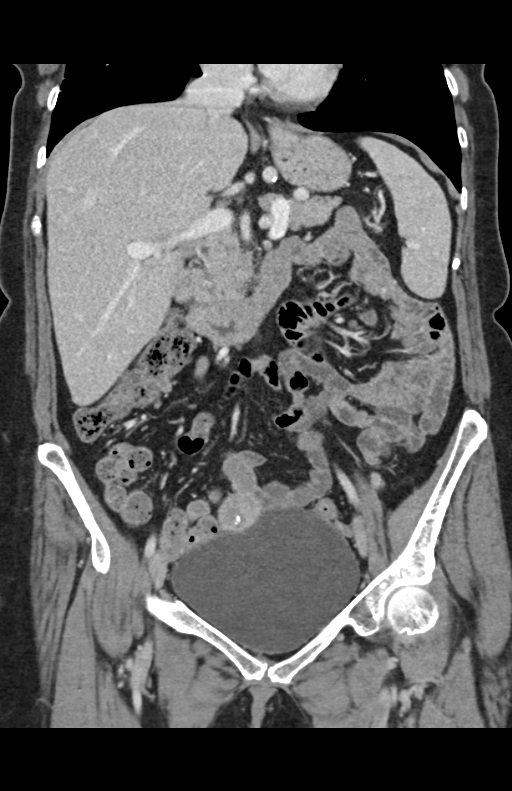
[im 64/116  soft-tissue]
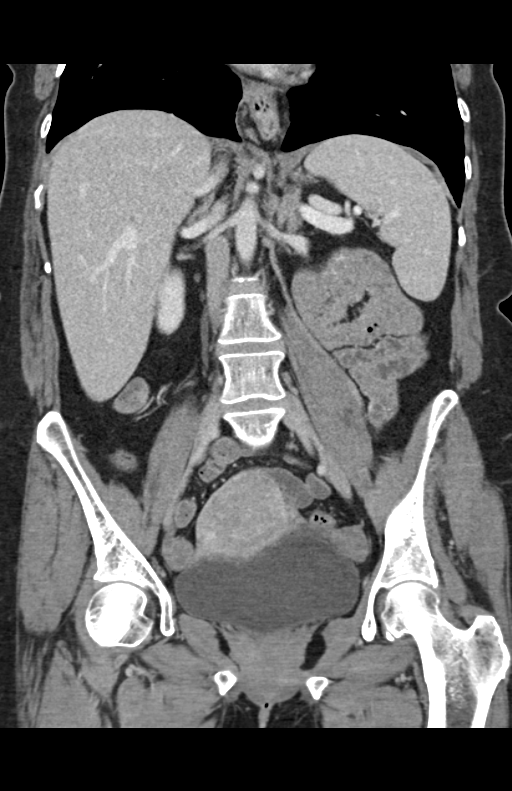

[15 of 46 positions shown; findings below may reference images not displayed]

FINDINGS: Lower chest: There is no pleural effusion. The lung bases are clear.

Hepatobiliary: There is no focal liver abnormality. The gallbladder
appears normal. There is no biliary dilatation.

Pancreas: Normal appearance of the pancreas.

Spleen: The spleen appears normal.

Adrenals/Urinary Tract: The adrenal glands are both normal.
Bilateral pelvocaliectasis noted. There is moderate distension of
the urinary bladder. This appears similar to the previous exam. On
the delayed images there is symmetric excretion of contrast
material.

Stomach/Bowel: There is a small hiatal hernia. The stomach is within
normal limits. The small bowel loops have a normal course and
caliber. No obstruction. Normal appearance of the colon.

Vascular/Lymphatic: Normal appearance of the abdominal aorta. No
enlarged retroperitoneal or mesenteric adenopathy. No enlarged
pelvic or inguinal lymph nodes.

Reproductive: Fibroid uterus measures 6.2 x 5.5 by 6.3 cm and has a
volume of 107 cc. The adnexal structures both appear normal.

Other: There is no ascites or focal fluid collections within the
abdomen or pelvis.

Musculoskeletal: Bilateral L5 pars defects noted. There is an
anterolisthesis of L5 on S1. There is a scoliosis deformity of the
lumbar spine which appears convex towards the right.
IMPRESSION: 1. No acute findings identified within the abdomen or pelvis.
2. The colon appears normal. An averaged stool burden is identified
at this time.
3. Fibroid uterus.
4. Moderate distension of the urinary bladder with bilateral
pelvocaliectasis, similar to previous exam. No evidence for
high-grade obstructive uropathy.
5. Small hiatal hernia.
